# Patient Record
Sex: Male | Born: 1955 | Hispanic: No | Marital: Married | Smoking: Current some day smoker
Health system: Southern US, Community
[De-identification: ages and names within clinical notes are randomized; demographics above are authoritative.]

## PROBLEM LIST (undated history)

## (undated) DIAGNOSIS — K223 Perforation of esophagus: Secondary | ICD-10-CM

## (undated) DIAGNOSIS — I10 Essential (primary) hypertension: Secondary | ICD-10-CM

## (undated) HISTORY — DX: Perforation of esophagus: K22.3

---

## 2010-01-09 ENCOUNTER — Emergency Department (HOSPITAL_COMMUNITY): Admission: EM | Admit: 2010-01-09 | Discharge: 2010-01-09 | Payer: Self-pay | Admitting: Family Medicine

## 2010-05-09 ENCOUNTER — Emergency Department (HOSPITAL_COMMUNITY)
Admission: EM | Admit: 2010-05-09 | Discharge: 2010-05-09 | Payer: Self-pay | Source: Home / Self Care | Admitting: Family Medicine

## 2010-06-06 ENCOUNTER — Emergency Department (HOSPITAL_COMMUNITY)
Admission: EM | Admit: 2010-06-06 | Discharge: 2010-06-06 | Payer: Self-pay | Source: Home / Self Care | Admitting: Family Medicine

## 2010-09-16 ENCOUNTER — Emergency Department (HOSPITAL_COMMUNITY)
Admission: EM | Admit: 2010-09-16 | Discharge: 2010-09-17 | Disposition: A | Discharge status: Home / Self Care | Payer: Self-pay | Attending: Emergency Medicine | Admitting: Emergency Medicine

## 2010-09-16 DIAGNOSIS — R1110 Vomiting, unspecified: Secondary | ICD-10-CM | POA: Insufficient documentation

## 2010-09-16 DIAGNOSIS — R079 Chest pain, unspecified: Secondary | ICD-10-CM | POA: Insufficient documentation

## 2010-09-16 DIAGNOSIS — R1310 Dysphagia, unspecified: Secondary | ICD-10-CM | POA: Insufficient documentation

## 2010-09-16 DIAGNOSIS — R252 Cramp and spasm: Secondary | ICD-10-CM | POA: Insufficient documentation

## 2010-09-17 ENCOUNTER — Emergency Department (HOSPITAL_COMMUNITY): Payer: Self-pay

## 2010-09-17 LAB — COMPREHENSIVE METABOLIC PANEL
BUN: 19 mg/dL (ref 6–23)
CO2: 25 mEq/L (ref 19–32)
Calcium: 9.5 mg/dL (ref 8.4–10.5)
Chloride: 105 mEq/L (ref 96–112)
Creatinine, Ser: 0.98 mg/dL (ref 0.4–1.5)
GFR calc Af Amer: >60 mL/min (ref >60)
GFR calc non Af Amer: >60 mL/min (ref >60)
Glucose, Bld: 93 mg/dL (ref 70–99)
Total Bilirubin: 1.7 mg/dL — ABNORMAL HIGH (ref 0.3–1.2)

## 2010-09-17 LAB — CBC
Hemoglobin: 15.4 g/dL (ref 13.0–17.0)
MCH: 29.7 pg (ref 26.0–34.0)
MCHC: 34.5 g/dL (ref 30.0–36.0)
MCV: 86.3 fL (ref 78.0–100.0)
RBC: 5.18 MIL/uL (ref 4.22–5.81)

## 2013-08-18 ENCOUNTER — Emergency Department (HOSPITAL_COMMUNITY): Payer: 59

## 2013-08-18 ENCOUNTER — Emergency Department (HOSPITAL_COMMUNITY)
Admission: EM | Admit: 2013-08-18 | Discharge: 2013-08-18 | Disposition: A | Discharge status: Home / Self Care | Payer: 59 | Attending: Emergency Medicine | Admitting: Emergency Medicine

## 2013-08-18 ENCOUNTER — Encounter (HOSPITAL_COMMUNITY): Payer: Self-pay | Admitting: Emergency Medicine

## 2013-08-18 DIAGNOSIS — J189 Pneumonia, unspecified organism: Secondary | ICD-10-CM

## 2013-08-18 DIAGNOSIS — J159 Unspecified bacterial pneumonia: Secondary | ICD-10-CM | POA: Insufficient documentation

## 2013-08-18 DIAGNOSIS — R1310 Dysphagia, unspecified: Secondary | ICD-10-CM | POA: Insufficient documentation

## 2013-08-18 LAB — BASIC METABOLIC PANEL
BUN: 16 mg/dL (ref 6–23)
CALCIUM: 9.1 mg/dL (ref 8.4–10.5)
CO2: 24 mEq/L (ref 19–32)
CREATININE: 1.17 mg/dL (ref 0.50–1.35)
Chloride: 104 mEq/L (ref 96–112)
GFR calc non Af Amer: 68 mL/min — ABNORMAL LOW (ref >90)
GFR, EST AFRICAN AMERICAN: 78 mL/min — AB (ref >90)
Glucose, Bld: 141 mg/dL — ABNORMAL HIGH (ref 70–99)
Potassium: 4.6 mEq/L (ref 3.7–5.3)
Sodium: 140 mEq/L (ref 137–147)

## 2013-08-18 LAB — CBC
HCT: 48.7 % (ref 39.0–52.0)
Hemoglobin: 17.4 g/dL — ABNORMAL HIGH (ref 13.0–17.0)
MCH: 31.7 pg (ref 26.0–34.0)
MCHC: 35.7 g/dL (ref 30.0–36.0)
MCV: 88.7 fL (ref 78.0–100.0)
PLATELETS: 162 10*3/uL (ref 150–400)
RBC: 5.49 MIL/uL (ref 4.22–5.81)
RDW: 13.5 % (ref 11.5–15.5)
WBC: 6.6 10*3/uL (ref 4.0–10.5)

## 2013-08-18 LAB — I-STAT TROPONIN, ED: TROPONIN I, POC: 0.01 ng/mL (ref 0.00–0.08)

## 2013-08-18 MED ORDER — AZITHROMYCIN 250 MG PO TABS: ORAL_TABLET | ORAL | Status: DC

## 2013-08-18 MED ORDER — DEXTROSE 5 % IV SOLN
1.0000 g | Freq: Once | INTRAVENOUS | Status: AC
  Administered 2013-08-18: 1 g via INTRAVENOUS
  Filled 2013-08-18: qty 10

## 2013-08-18 MED ORDER — SUCRALFATE 1 G PO TABS: 1.0000 g | ORAL_TABLET | Freq: Three times a day (TID) | ORAL | Status: DC

## 2013-08-18 NOTE — ED Notes (Signed)
Using translation line; pt reports when he eats or drinks, it gets stuck in his chest and he gets a headache, puts his hand in mouth and vomits. Pt reports this for 3.5 years.

## 2013-08-18 NOTE — Discharge Instructions (Signed)
Return here as needed.  Follow up with GI Dr. Provided.  Increase your fluid intake.  There is a pneumonia noted on your chest x-ray

## 2013-08-18 NOTE — ED Provider Notes (Signed)
CSN: [id number]     Arrival date & time 08/18/13  1323 History   First MD Initiated Contact with Patient 08/18/13 1607     Chief Complaint  Patient presents with  . Chest Pain     (Consider location/radiation/quality/duration/timing/severity/associated sxs/prior Treatment) HPI Patient presents emergency department with a year and a half of difficulty swallowing.  The causes a pain in his throat and chest.  Patient also has had a cough for the last 3 days.  The patient, states, that he feels, like that he also has a hard time e to of acid reflux.  History is obtained through the son, who is translating.  There is an appears to be a very accurate historian.  Patient denies nausea, vomiting, diarrhea, abdominal pain, weakness, dizziness, shortness of breath, rash, cough, back pain, neck pain, dysuria, or syncope.  Patient did not take any medications prior to arrival. History reviewed. No pertinent past medical history. History reviewed. No pertinent past surgical history. No family history on file. History  Substance Use Topics  . Smoking status: Never Smoker   . Smokeless tobacco: Not on file  . Alcohol Use: No    Review of Systems  All other systems negative except as documented in the HPI. All pertinent positives and negatives as reviewed in the HPI.   Allergies  Review of patient's allergies indicates no known allergies.  Home Medications  No current outpatient prescriptions on file. BP 162/100  Pulse 73  Temp(Src) 98.3 F (36.8 C) (Oral)  Resp 17  SpO2 100% Physical Exam  Nursing note and vitals reviewed. Constitutional: He is oriented to person, place, and time. He appears well-developed and well-nourished. No distress.  HENT:  Head: Normocephalic and atraumatic.  Mouth/Throat: Oropharynx is clear and moist.  Eyes: Pupils are equal, round, and reactive to light.  Neck: Normal range of motion. Neck supple.  Cardiovascular: Normal rate, regular rhythm and normal heart  sounds.  Exam reveals no gallop and no friction rub.   No murmur heard. Pulmonary/Chest: Effort normal. No respiratory distress. He has no wheezes. He has rhonchi in the right upper field, the right middle field and the right lower field. He has no rales.  Neurological: He is alert and oriented to person, place, and time. He exhibits normal muscle tone. Coordination normal.  Skin: Skin is warm and dry.    ED Course  Procedures (including critical care time) Labs Review Labs Reviewed  CBC - Abnormal; Notable for the following:    Hemoglobin 17.4 (*)    All other components within normal limits  BASIC METABOLIC PANEL - Abnormal; Notable for the following:    Glucose, Bld 141 (*)    GFR calc non Af Amer 68 (*)    GFR calc Af Amer 78 (*)    All other components within normal limits  I-STAT TROPOININ, ED   Imaging Review Dg Chest 2 View  08/18/2013   CLINICAL DATA:  Chest pain  EXAM: CHEST  2 VIEW  COMPARISON:  DG CHEST 2 VIEW dated 09/17/2010  FINDINGS: The lungs are adequately inflated. The interstitial markings are mildly increased bilaterally. The cardiopericardial silhouette is top-normal in size. The pulmonary vascularity is not clearly engorged. There is mild tortuosity of the descending thoracic aorta. There is no pleural effusion or pneumothorax.  IMPRESSION: Mildly increased pulmonary interstitial markings suggest interstitial pneumonia or subsegmental atelectasis. No alveolar pneumonia is demonstrated and there is no overt evidence of CHF. Followup films following therapy are recommended to assure  clearing.   Electronically Signed   By: David  Swazil[place]   On: 08/18/2013 14:37    Patient retreated for possible interstitial pneumonia.  Told to return here as needed.  Given followup with GI for further evaluation of his dysphagia.  This been an ongoing problem.  Told to return here as needed.  The patient in stable here in the emergency department patient's questions were answered and the son  translates.  He voiced understanding of the plan    Carlyle DollyChristopher W Daphnie Venturini, PA-C 08/23/13 0133

## 2013-08-18 NOTE — ED Notes (Signed)
Pt reports for 1.5 years after eating, feels pain in chest and burning in his throat. Reports his chest hurts him now. Denies SOB. Pt speaks [place], pt son translating. Pt is a x4. Denies any cardiac issues.

## 2013-08-18 NOTE — ED Notes (Signed)
[phone] - call this number if pt is discharged [phone] -

## 2013-08-18 NOTE — ED Notes (Signed)
Pt reports non-radiating central CP x 1.5 years, worse when he eats. Described as burning. Also reports SOB x 1.5 years with productive cough. NAD. Denies pain at this time.

## 2013-08-23 NOTE — ED Provider Notes (Signed)
Medical screening examination/treatment/procedure(s) were performed by non-physician practitioner and as supervising physician I was immediately available for consultation/collaboration.   EKG Interpretation   Date/Time:  Friday August 18 2013 13:31:53 EDT Ventricular Rate:  87 PR Interval:  156 QRS Duration: 102 QT Interval:  358 QTC Calculation: 430 R Axis:   56 Text Interpretation:  Normal sinus rhythm Normal ECG ED PHYSICIAN  INTERPRETATION AVAILABLE IN [facility] Confirmed by TEST, Record  ([id number]) on 08/20/2013 9:01:37 AM       Ethelda ChickMartha K Linker, MD 08/23/13 [phone]

## 2014-08-03 ENCOUNTER — Encounter (HOSPITAL_COMMUNITY): Payer: Self-pay | Admitting: Anesthesiology

## 2014-08-03 ENCOUNTER — Encounter (HOSPITAL_COMMUNITY): Payer: Self-pay | Admitting: Physical Medicine and Rehabilitation

## 2014-08-03 ENCOUNTER — Encounter (HOSPITAL_COMMUNITY)
Admission: EM | Disposition: A | Discharge status: Home / Self Care | Payer: Self-pay | Source: Home / Self Care | Attending: Internal Medicine

## 2014-08-03 ENCOUNTER — Inpatient Hospital Stay (HOSPITAL_COMMUNITY)
Admission: EM | Admit: 2014-08-03 | Discharge: 2014-08-10 | DRG: 393 | Disposition: A | Discharge status: Home / Self Care | Payer: 59 | Attending: Internal Medicine | Admitting: Internal Medicine

## 2014-08-03 ENCOUNTER — Emergency Department (HOSPITAL_COMMUNITY): Payer: 59

## 2014-08-03 DIAGNOSIS — R633 Feeding difficulties: Secondary | ICD-10-CM | POA: Diagnosis present

## 2014-08-03 DIAGNOSIS — T18128A Food in esophagus causing other injury, initial encounter: Secondary | ICD-10-CM

## 2014-08-03 DIAGNOSIS — K229 Disease of esophagus, unspecified: Secondary | ICD-10-CM | POA: Diagnosis present

## 2014-08-03 DIAGNOSIS — R198 Other specified symptoms and signs involving the digestive system and abdomen: Secondary | ICD-10-CM

## 2014-08-03 DIAGNOSIS — F17220 Nicotine dependence, chewing tobacco, uncomplicated: Secondary | ICD-10-CM | POA: Diagnosis present

## 2014-08-03 DIAGNOSIS — K223 Perforation of esophagus: Secondary | ICD-10-CM | POA: Diagnosis present

## 2014-08-03 DIAGNOSIS — K210 Gastro-esophageal reflux disease with esophagitis: Secondary | ICD-10-CM | POA: Diagnosis present

## 2014-08-03 DIAGNOSIS — D72829 Elevated white blood cell count, unspecified: Secondary | ICD-10-CM | POA: Diagnosis present

## 2014-08-03 DIAGNOSIS — T18198A Other foreign object in esophagus causing other injury, initial encounter: Secondary | ICD-10-CM | POA: Diagnosis present

## 2014-08-03 DIAGNOSIS — I10 Essential (primary) hypertension: Secondary | ICD-10-CM | POA: Diagnosis present

## 2014-08-03 DIAGNOSIS — R1310 Dysphagia, unspecified: Secondary | ICD-10-CM | POA: Diagnosis present

## 2014-08-03 DIAGNOSIS — T18198S Other foreign object in esophagus causing other injury, sequela: Secondary | ICD-10-CM | POA: Diagnosis not present

## 2014-08-03 HISTORY — PX: ESOPHAGOGASTRODUODENOSCOPY: SHX5428

## 2014-08-03 HISTORY — DX: Essential (primary) hypertension: I10

## 2014-08-03 LAB — CBC WITH DIFFERENTIAL/PLATELET
BASOS ABS: 0 10*3/uL (ref 0.0–0.1)
Basophils Relative: 0 % (ref 0–1)
EOS ABS: 0.3 10*3/uL (ref 0.0–0.7)
Eosinophils Relative: 2 % (ref 0–5)
HCT: 48 % (ref 39.0–52.0)
HEMOGLOBIN: 16.7 g/dL (ref 13.0–17.0)
LYMPHS ABS: 1.7 10*3/uL (ref 0.7–4.0)
LYMPHS PCT: 13 % (ref 12–46)
MCH: 31.4 pg (ref 26.0–34.0)
MCHC: 34.8 g/dL (ref 30.0–36.0)
MCV: 90.2 fL (ref 78.0–100.0)
Monocytes Absolute: 1 10*3/uL (ref 0.1–1.0)
Monocytes Relative: 8 % (ref 3–12)
NEUTROS PCT: 77 % (ref 43–77)
Neutro Abs: 9.9 10*3/uL — ABNORMAL HIGH (ref 1.7–7.7)
PLATELETS: 134 10*3/uL — AB (ref 150–400)
RBC: 5.32 MIL/uL (ref 4.22–5.81)
RDW: 14 % (ref 11.5–15.5)
WBC: 13 10*3/uL — AB (ref 4.0–10.5)

## 2014-08-03 LAB — COMPREHENSIVE METABOLIC PANEL
ALK PHOS: 51 U/L (ref 39–117)
ALT: 22 U/L (ref 0–53)
ANION GAP: 8 (ref 5–15)
AST: 27 U/L (ref 0–37)
Albumin: 3.6 g/dL (ref 3.5–5.2)
BILIRUBIN TOTAL: 2 mg/dL — AB (ref 0.3–1.2)
BUN: 16 mg/dL (ref 6–23)
CALCIUM: 9.2 mg/dL (ref 8.4–10.5)
CO2: 27 mmol/L (ref 19–32)
CREATININE: 1.29 mg/dL (ref 0.50–1.35)
Chloride: 100 mmol/L (ref 96–112)
GFR calc Af Amer: 69 mL/min — ABNORMAL LOW (ref >90)
GFR calc non Af Amer: 60 mL/min — ABNORMAL LOW (ref >90)
GLUCOSE: 137 mg/dL — AB (ref 70–99)
Potassium: 3.8 mmol/L (ref 3.5–5.1)
Sodium: 135 mmol/L (ref 135–145)
Total Protein: 7.8 g/dL (ref 6.0–8.3)

## 2014-08-03 LAB — I-STAT TROPONIN, ED: Troponin i, poc: 0.01 ng/mL (ref 0.00–0.08)

## 2014-08-03 LAB — LIPASE, BLOOD: Lipase: 27 U/L (ref 11–59)

## 2014-08-03 LAB — GLUCOSE, CAPILLARY: GLUCOSE-CAPILLARY: 92 mg/dL (ref 70–99)

## 2014-08-03 SURGERY — EGD (ESOPHAGOGASTRODUODENOSCOPY)
Anesthesia: Moderate Sedation

## 2014-08-03 SURGERY — EGD (ESOPHAGOGASTRODUODENOSCOPY)
Anesthesia: Moderate Sedation | Laterality: Left

## 2014-08-03 MED ORDER — ONDANSETRON 4 MG PO TBDP
4.0000 mg | ORAL_TABLET | Freq: Once | ORAL | Status: AC
  Administered 2014-08-03: 4 mg via ORAL
  Filled 2014-08-03: qty 1

## 2014-08-03 MED ORDER — MIDAZOLAM HCL 10 MG/2ML IJ SOLN
INTRAMUSCULAR | Status: DC | PRN
  Administered 2014-08-03 (×5): 2 mg via INTRAVENOUS

## 2014-08-03 MED ORDER — SODIUM CHLORIDE 0.9 % IV SOLN
INTRAVENOUS | Status: DC
  Administered 2014-08-03: 20 mL/h via INTRAVENOUS

## 2014-08-03 MED ORDER — GI COCKTAIL ~~LOC~~
30.0000 mL | Freq: Once | ORAL | Status: AC
  Administered 2014-08-03: 30 mL via ORAL
  Filled 2014-08-03: qty 30

## 2014-08-03 MED ORDER — SODIUM CHLORIDE 0.9 % IV SOLN
INTRAVENOUS | Status: DC
  Administered 2014-08-03: 20:00:00 via INTRAVENOUS

## 2014-08-03 MED ORDER — ONDANSETRON HCL 4 MG/2ML IJ SOLN
4.0000 mg | Freq: Four times a day (QID) | INTRAMUSCULAR | Status: DC | PRN
Start: 2014-08-03 — End: 2014-08-10

## 2014-08-03 MED ORDER — SODIUM CHLORIDE 0.9 % IV SOLN
INTRAVENOUS | Status: AC
Start: 2014-08-03 — End: 2014-08-07
  Administered 2014-08-03: 20:00:00 via INTRAVENOUS
  Administered 2014-08-04 – 2014-08-05 (×2): 1000 mL via INTRAVENOUS
  Administered 2014-08-07: 12:00:00 via INTRAVENOUS

## 2014-08-03 MED ORDER — FENTANYL CITRATE 0.05 MG/ML IJ SOLN
INTRAMUSCULAR | Status: DC | PRN
  Administered 2014-08-03 (×4): 25 ug via INTRAVENOUS

## 2014-08-03 MED ORDER — PANTOPRAZOLE SODIUM 40 MG IV SOLR
40.0000 mg | INTRAVENOUS | Status: DC
  Administered 2014-08-03 – 2014-08-05 (×3): 40 mg via INTRAVENOUS
  Filled 2014-08-03 (×4): qty 40

## 2014-08-03 MED ORDER — MORPHINE SULFATE 2 MG/ML IJ SOLN
1.0000 mg | INTRAMUSCULAR | Status: DC | PRN
  Administered 2014-08-03 – 2014-08-06 (×7): 1 mg via INTRAVENOUS
  Filled 2014-08-03 (×7): qty 1

## 2014-08-03 MED ORDER — IOHEXOL 300 MG/ML  SOLN: 100.0000 mL | Freq: Once | INTRAMUSCULAR | Status: AC | PRN

## 2014-08-03 NOTE — Op Note (Addendum)
[facility] [address] [place], [id number]   OPERATIVE PROCEDURE REPORT  PATIENT :[name] Daugherty, Dario  MR#: [id number] BIRTHDATE :06-10-1955 GENDER: male ENDOSCOPIST: Lorenza BurtonJyothi N Bedelia Pong, MD ASSISTANT:   Omelia BlackwaterShelby Carpenter, RNM & Windell HummingbirdSamuel Tetteh, technician. PROCEDURE DATE: 08/03/2014 PRE-PROCEDURE PREPERATION: Patient fasted for 4 hours prior to procedure. PRE-PROCEDURE PHYSICAL: Patient has stable vital signs.  Neck is supple.  There is no JVD, thyromegaly or LAD.  Chest clear to auscultation.  S1 and S2 regular.  Abdomen soft, non-distended, non-tender with NABS. PROCEDURE:     Esophagoscopy with attempt to remove foreign body.  ASA CLASS:     Class II INDICATIONS:     1) Dysphagia 2) GERD. MEDICATIONS:     Fentanyl 100 mcg and Versed 10 mg IV. TOPICAL ANESTHETIC:   none given.  DESCRIPTION OF PROCEDURE: After the risks benefits and alternatives of the procedure were thoroughly explained, informed consent was obtained.  The [employer] G[employer]OR[employer]COP[employer] [id number]  was introduced through the mouth and advanced to the second portion of the duodenum , without limitations. The instrument was slowly withdrawn as the mucosa was fully examined. Estimated blood loss is zero unless otherwise noted in this procedure report.    ESOPHAGUS: There was a foreign body impacted in the distal esophagus above the GEJ; The proximal esophagus widely patent. Inspite of multiple attempts I was unable to remocve this foreign body or osuh it into the stomach. We tried using the alligator forceps but it was diffcult to dislodge it. The stomach was not intubated. The procedure was terminated with plans to redo the procedure in the OR after intubating the patient. d. The scope was then withdrawn from the patient and the procedure terminated. The patient tolerated the procedure without immediate complications.  IMPRESSION:  Foreign body impacted in distal esophagus at the GEJ.  RECOMMENDATIONS:      1) Admit patient to the hospital. 2) Keep him NPO for now. 3) Start PPI's IV.  REPEAT EXAM:  for EGD with removal of foreign body with MAC in the OR.; Case discussed with Dr. Mammie RussianHendrickson-CVTS for backup.  DISCHARGE INSTRUCTIONS: standard instructions given.  _______________________________ eSignedLorenza Burton:  Camara Rosander N Clement Deneault, MD 08/03/2014 4:11 PM Revised: 08/03/2014 4:11 PM  CPT CODES:     43230 Upper gastrointestinal endoscopy of esophagus  DIAGNOSIS CODES:     R13.10 Dysphagia,unspecified, K21.9  The ICD and CPT codes recommended by this software are interpretations from the data that the clinical staff has captured with the software.  The verification of the translation of this report to the ICD and CPT codes and modifiers is the sole responsibility of the health care institution and practicing physician where this report was generated.  [employer] will not be held responsible for the validity of the ICD and CPT codes included on this report.  AMA assumes no liability for data contained or not contained herein. CPT is a Publishing rights managerregistered trademark of the [employer].   CC: Dr. Annitta JerseyPlunkett-ER MD  PATIENT NAME:  [name] Daugherty, Gualberto MR#: [id number]

## 2014-08-03 NOTE — ED Notes (Signed)
Pt transported to endoscopy, wife and belongings with pt

## 2014-08-03 NOTE — ED Provider Notes (Addendum)
CSN: [id number]     Arrival date & time 08/03/14  1018 History   First MD Initiated Contact with Patient 08/03/14 1019     Chief Complaint  Patient presents with  . Dysphagia  . Abdominal Pain     (Consider location/radiation/quality/duration/timing/severity/associated sxs/prior Treatment) Patient is a 59 y.o. male presenting with abdominal pain. The history is provided by the patient. The history is limited by a language barrier. A language interpreter was used (family member).  Abdominal Pain Pain location:  Epigastric Pain quality: burning and sharp   Pain radiates to:  Chest Pain severity:  Moderate Onset quality:  Gradual Duration:  3 days Timing:  Constant Progression:  Worsening Chronicity:  Recurrent Context: eating   Context: not alcohol use   Context comment:  Family member states that for the last 3 days with eating he has been vomiting anytime he eats or drinks. Relieved by:  Nothing Worsened by:  Eating Ineffective treatments:  Acetaminophen Associated symptoms: anorexia, fever, nausea and vomiting   Associated symptoms: no cough, no diarrhea, no dysuria, no hematemesis, no shortness of breath and no sore throat   Associated symptoms comment:  Pt denies choking on any food prior to symptoms starting.  No productive cough, tobacco use or heavy alcohol use.  Takes no medications regularly Risk factors: no alcohol abuse, no aspirin use and has not had multiple surgeries     History reviewed. No pertinent past medical history. History reviewed. No pertinent past surgical history. No family history on file. History  Substance Use Topics  . Smoking status: Never Smoker   . Smokeless tobacco: Not on file  . Alcohol Use: Yes     Comment: social    Review of Systems  Constitutional: Positive for fever.  HENT: Negative for sore throat.   Respiratory: Negative for cough and shortness of breath.   Gastrointestinal: Positive for nausea, vomiting, abdominal pain and  anorexia. Negative for diarrhea and hematemesis.  Genitourinary: Negative for dysuria.  All other systems reviewed and are negative.     Allergies  Review of patient's allergies indicates no known allergies.  Home Medications   Prior to Admission medications   Medication Sig Start Date End Date Taking? Authorizing Provider  azithromycin (ZITHROMAX) 250 MG tablet 2 by mouth day one. 1 by mouth days 2 through 5 08/18/13   Charlestine Night, PA-C  sucralfate (CARAFATE) 1 G tablet Take 1 tablet (1 g total) by mouth 4 (four) times daily -  with meals and at bedtime. 08/18/13   Christopher Lawyer, PA-C   BP 179/97 mmHg  Pulse 89  Temp(Src) 98.7 F (37.1 C) (Oral)  Resp 17  SpO2 100% Physical Exam  Constitutional: He is oriented to person, place, and time. He appears well-developed and well-nourished. No distress.  HENT:  Head: Normocephalic and atraumatic.  Mouth/Throat: Oropharynx is clear and moist.  Eyes: Conjunctivae and EOM are normal. Pupils are equal, round, and reactive to light.  Neck: Normal range of motion. Neck supple.  Cardiovascular: Normal rate, regular rhythm and intact distal pulses.   No murmur heard. Pulmonary/Chest: Effort normal and breath sounds normal. No respiratory distress. He has no wheezes. He has no rales.  Abdominal: Soft. He exhibits no distension. There is tenderness in the epigastric area. There is no rebound and no guarding.  Musculoskeletal: Normal range of motion. He exhibits no edema or tenderness.  Neurological: He is alert and oriented to person, place, and time.  Skin: Skin is warm and dry. No  rash noted. No erythema.  Psychiatric: He has a normal mood and affect. His behavior is normal.  Nursing note and vitals reviewed.   ED Course  Procedures (including critical care time) Labs Review Labs Reviewed  CBC WITH DIFFERENTIAL/PLATELET - Abnormal; Notable for the following:    WBC 13.0 (*)    Platelets 134 (*)    Neutro Abs 9.9 (*)    All  other components within normal limits  COMPREHENSIVE METABOLIC PANEL - Abnormal; Notable for the following:    Glucose, Bld 137 (*)    Total Bilirubin 2.0 (*)    GFR calc non Af Amer 60 (*)    GFR calc Af Amer 69 (*)    All other components within normal limits  LIPASE, BLOOD  I-STAT TROPOININ, ED    Imaging Review No results found.   EKG Interpretation   Date/Time:  Friday August 03 2014 10:47:03 EDT Ventricular Rate:  80 PR Interval:  161 QRS Duration: 102 QT Interval:  348 QTC Calculation: 401 R Axis:   81 Text Interpretation:  Sinus rhythm Probable left atrial enlargement No  significant change since last tracing Confirmed by Anitra LauthPLUNKETT  MD, Alphonzo LemmingsWHITNEY  [phone]([id number]) on 08/03/2014 11:00:25 AM      MDM   Final diagnoses:  Dysphagia  Food impaction of esophagus, initial encounter    Patient does not speak English who is here with a family member who does. Patient presents today with a three-day history of ongoing vomiting. He is complaining of epigastric pain that radiates into his chest. Family member states anytime he tries to eat anything he vomits it back up. It is difficult to discern if patient is having regurgitation versus vomiting. However he does not recall eating or choking on any food prior to symptoms starting. Family member states last night he had a fever but they gave him Tylenol. Patient denies cough or respiratory symptoms. Patient does not use tobacco is not a heavy alcohol user and takes no medications regularly. Patient does not see a doctor regularly.  Concern for GERD versus viral syndrome versus possible esophageal stricture. CBC, CMP, lipase, troponin, EKG pending. Patient given ODT Zofran and will try a GI cocktail  11:48 AM Labs with a mild leukocytosis of 13,000 but otherwise normal troponin and CMP and lipase. Patient has no lower abdominal tenderness or pain concerning for appendicitis or diverticulitis.  After Zofran and GI cocktail patient is feeling  better.   1:15 PM However when attempting to by mouth challenge patient is unable to keep down applesauce or more solid food. He immediately regurgitates. Concerned that he possibly could have been esophageal stricture or food bolus impaction. Will discuss with GI  1:47 PM Discussed with Dr. Loreta AveMann who will take pt for endoscopy.  3:59 PM Endoscopy patient had a friable area with bleeding and possibly of bone or other obstructing mass causing his dysphasia. Patient will need to be admitted and will go back for repeat endoscopy tomorrow. Also will do a CT for further evaluation  Gwyneth SproutWhitney Tudor Chandley, MD 08/03/14 1349  Gwyneth SproutWhitney Tyriq Moragne, MD 08/03/14 1559

## 2014-08-03 NOTE — Progress Notes (Signed)
[name] Simpsonek Hartinger [id number] Admission Data: 08/03/2014 6:56 PM Attending Provider: Zannie CovePreetha Joseph, MD  PCP:No PCP Per Patient Consults/ Treatment Team:    [name] Daugherty is a 59 y.o. male patient admitted from ED awake, alert  & orientated  X 3,  No Order, VSS - Blood pressure 154/88, pulse 72, temperature 98.7 F (37.1 C), temperature source Oral, resp. rate 16, SpO2 99 %.,no c/o shortness of breath, no c/o chest pain, no distress noted.   IV site WDL:  Right arm, running NS at 4020ml/hour.  Allergies:  No Known Allergies   Past Medical History  Diagnosis Date  . Hypertension      Pt orientation to unit, room and routine. Information packet given to patient/family.  Admission INP armband ID verified with patient/family, and in place. SR up x 2, fall risk assessment complete with Patient and family verbalizing understanding of risks associated with falls. Pt verbalizes an understanding of how to use the call bell and to call for help before getting out of bed.  Skin, clean-dry- intact without evidence of bruising, or skin tears.   No evidence of skin break down noted on exam.    Will cont to monitor and assist as needed.  [name] Daugherty, [name] Cooner L, RN 08/03/2014 6:56 PM

## 2014-08-03 NOTE — H&P (Signed)
[facility] History and Physical  Alwin Lanigan XBM:[id number] DOB: 09-Apr-1956 DOA: 08/03/2014  Referring physician: EDP PCP: No PCP Per Patient   Chief Complaint: dysphagia  HPI: Denilson Salminen is a 59 y.o. male of Nepali descent with no significant PMH presents to the ER with intermittent dysphagia for the last 2years, this worsened in the last week and now noted to have dysphagia with solids and liquids. Translated by sons, they deny any fevers, chills, night sweats. No weight loss noted by family. Seen in Er by Dr.Mann who did an EGD and found a suspected foreign body than could not be retrieved.   Review of Systems: positives bolded Constitutional:  No weight loss, night sweats, Fevers, chills, fatigue.  HEENT:  No headaches, Difficulty swallowing,Tooth/dental problems,Sore throat,  No sneezing, itching, ear ache, nasal congestion, post nasal drip,  Cardio-vascular:  No chest pain, Orthopnea, PND, swelling in lower extremities, anasarca, dizziness, palpitations  GI:  No heartburn, indigestion, abdominal pain, nausea, vomiting, diarrhea, change in bowel habits, loss of appetite  Resp:  No shortness of breath with exertion or at rest. No excess mucus, no productive cough, No non-productive cough, No coughing up of blood.No change in color of mucus.No wheezing.No chest wall deformity  Skin:  no rash or lesions.  GU:  no dysuria, change in color of urine, no urgency or frequency. No flank pain.  Musculoskeletal:  No joint pain or swelling. No decreased range of motion. No back pain.  Psych:  No change in mood or affect. No depression or anxiety. No memory loss.   Past Medical History  Diagnosis Date  . Hypertension    History reviewed. No pertinent past surgical history. Social History:  reports that he has never smoked. He does not have any smokeless tobacco history on file. He reports that he drinks alcohol. He reports that he does not use illicit drugs.  No Known  Allergies  Family history. -unknown, reports parents died of old age  Prior to Admission medications   Not on File   Physical Exam: Filed Vitals:   08/03/14 1535 08/03/14 1600 08/03/14 1645 08/03/14 1700  BP: 168/106 117/86 114/85 113/90  Pulse: 93 87 80 83  Temp:      TempSrc:      Resp: SpO2: 99% 92% 99% 98%    Wt Readings from Last 3 Encounters:  No data found for Wt    General:  Appears calm and comfortable, no distress, AOx3 Eyes: PERRL, normal lids, irises & conjunctiva ENT: grossly normal hearing, lips & tongue Neck: no LAD, masses or thyromegaly Cardiovascular: RRR, no m/r/g. No LE edema. Telemetry: SR, no arrhythmias  Respiratory: CTA bilaterally, no w/r/r. Normal respiratory effort. Abdomen: soft, Nt, ND, BS present Skin: no rash or induration seen on limited exam Musculoskeletal: grossly normal tone BUE/BLE Psychiatric: grossly normal mood and affect, speech fluent and appropriate Neurologic: grossly non-focal.          Labs on Admission:  Basic Metabolic Panel:  Recent Labs Lab 08/03/14 1044  NA 135  K 3.8  CL 100  CO2 27  GLUCOSE 137*  BUN 16  CREATININE 1.29  CALCIUM 9.2   Liver Function Tests:  Recent Labs Lab 08/03/14 1044  AST 27  ALT 22  ALKPHOS 51  BILITOT 2.0*  PROT 7.8  ALBUMIN 3.6    Recent Labs Lab 08/03/14 1044  LIPASE 27   No results for input(s): AMMONIA in the last 168 hours. CBC:  Recent  Labs Lab 08/03/14 1044  WBC 13.0*  NEUTROABS 9.9*  HGB 16.7  HCT 48.0  MCV 90.2  PLT 134*   Cardiac Enzymes: No results for input(s): CKTOTAL, CKMB, CKMBINDEX, TROPONINI in the last 168 hours.  BNP (last 3 results) No results for input(s): BNP in the last 8760 hours.  ProBNP (last 3 results) No results for input(s): PROBNP in the last 8760 hours.  CBG: No results for input(s): GLUCAP in the last 168 hours.  Radiological Exams on Admission: No results found.  Assessment/Plan     1.Dysphagia -due to foreign body with possible underlying mass -long h/o chewing tobacco -CT abd pelvis ordered by EDP, just completed will FU -D/w Dr.Mann, plan for repeat EGD under anesthesia tomorrow  -Keep NPo except ice chips for now and then NPO after midnight -IV PPI, IVF -further management as condition evolves   Code Status: Full Code DVT Prophylaxis: SCds  Family Communication: d/w sons at bedside Disposition Plan: inpatient  Time spent: 60min  [facility] Health SystemJOSEPH,Perel Hauschild [facility] Pager [phone]

## 2014-08-03 NOTE — ED Notes (Signed)
Pt presents to department for evaluation of difficulty swallowing and epigastric pain. Wife states he is unable to swallow foods, also states epigastric tenderness. Ongoing x3 days, states issues with same in the past. Pt is alert and oriented x4.

## 2014-08-03 NOTE — ED Notes (Signed)
MD at bedside. 

## 2014-08-03 NOTE — ED Notes (Signed)
Attempted report 

## 2014-08-03 NOTE — Consult Note (Signed)
Unassigned patient Reason for Consult: ?Food impaction/GERD. Referring Physician: Dr. Maryan Rued.  [name] Daugherty is an 59 y.o. male.  HPI: 59 year old [place] male, presented to the ED today complaining of difficulty swallowing solids since last Tuesday 07/31/14. He has had a 2 year history of progressive dysphagia but for the last 4 days he has not even been able to keep liquids down. He has a good appetite and his weight has been stable. He denies a history of melena or hematochezia. He has been having some epigastric paina nd chest pain since 07/31/14. Most of the history was procured with the help of an interpreter.      History reviewed. No pertinent past medical history.  History reviewed. No pertinent past surgical history.  No family history on file.  Social History:  reports that he has never smoked. He chews tobacco and has been doing this for several years. He reports that he drinks alcohol, 1-2 beers per day. He reports that he does not use illicit drugs.  Allergies: No Known Allergies  Medications: I have reviewed the patient's current medications.  Results for orders placed or performed during the hospital encounter of 08/03/14 (from the past 48 hour(s))  CBC with Differential/Platelet     Status: Abnormal   Collection Time: 08/03/14 10:44 AM  Result Value Ref Range   WBC 13.0 (H) 4.0 - 10.5 K/uL   RBC 5.32 4.22 - 5.81 MIL/uL   Hemoglobin 16.7 13.0 - 17.0 g/dL   HCT 48.0 39.0 - 52.0 %   MCV 90.2 78.0 - 100.0 fL   MCH 31.4 26.0 - 34.0 pg   MCHC 34.8 30.0 - 36.0 g/dL   RDW 14.0 11.5 - 15.5 %   Platelets 134 (L) 150 - 400 K/uL   Neutrophils Relative % 77 43 - 77 %   Neutro Abs 9.9 (H) 1.7 - 7.7 K/uL   Lymphocytes Relative 13 12 - 46 %   Lymphs Abs 1.7 0.7 - 4.0 K/uL   Monocytes Relative 8 3 - 12 %   Monocytes Absolute 1.0 0.1 - 1.0 K/uL   Eosinophils Relative 2 0 - 5 %   Eosinophils Absolute 0.3 0.0 - 0.7 K/uL   Basophils Relative 0 0 - 1 %   Basophils Absolute 0.0 0.0  - 0.1 K/uL  Comprehensive metabolic panel     Status: Abnormal   Collection Time: 08/03/14 10:44 AM  Result Value Ref Range   Sodium 135 135 - 145 mmol/L   Potassium 3.8 3.5 - 5.1 mmol/L   Chloride 100 96 - 112 mmol/L   CO2 27 19 - 32 mmol/L   Glucose, Bld 137 (H) 70 - 99 mg/dL   BUN 16 6 - 23 mg/dL   Creatinine, Ser 1.29 0.50 - 1.35 mg/dL   Calcium 9.2 8.4 - 10.5 mg/dL   Total Protein 7.8 6.0 - 8.3 g/dL   Albumin 3.6 3.5 - 5.2 g/dL   AST 27 0 - 37 U/L   ALT 22 0 - 53 U/L   Alkaline Phosphatase 51 39 - 117 U/L   Total Bilirubin 2.0 (H) 0.3 - 1.2 mg/dL   GFR calc non Af Amer 60 (L) >90 mL/min   GFR calc Af Amer 69 (L) >90 mL/min    Comment: (NOTE) The eGFR has been calculated using the CKD EPI equation. This calculation has not been validated in all clinical situations. eGFR's persistently <90 mL/min signify possible Chronic Kidney Disease.    Anion gap 8 5 -  15  Lipase, blood     Status: None   Collection Time: 08/03/14 10:44 AM  Result Value Ref Range   Lipase 27 11 - 59 U/L  I-stat troponin, ED     Status: None   Collection Time: 08/03/14 10:50 AM  Result Value Ref Range   Troponin i, poc 0.01 0.00 - 0.08 ng/mL   Comment 3            Comment: Due to the release kinetics of cTnI, a negative result within the first hours of the onset of symptoms does not rule out myocardial infarction with certainty. If myocardial infarction is still suspected, repeat the test at appropriate intervals.    No results found.  Review of Systems  Constitutional: Negative.   HENT: Negative.   Eyes: Negative.   Respiratory: Negative.   Cardiovascular: Negative.   Gastrointestinal: Positive for heartburn, nausea, vomiting and abdominal pain. Negative for diarrhea, constipation, blood in stool and melena.  Genitourinary: Negative.   Musculoskeletal: Negative.   Skin: Negative.   Neurological: Negative.   Psychiatric/Behavioral: Negative.    Blood pressure 146/95, pulse 81,  temperature 99.1 F (37.3 C), temperature source Oral, resp. rate 23, SpO2 96 %. Physical Exam  Constitutional: He is oriented to person, place, and time. He appears well-developed and well-nourished.  He has poor dentition with tobacco stained teeth  HENT:  Head: Normocephalic and atraumatic.  Eyes: Conjunctivae and EOM are normal. Pupils are equal, round, and reactive to light.  Neck: Normal range of motion. Neck supple.  GI: Soft. Bowel sounds are normal.  Musculoskeletal: Normal range of motion.  Neurological: He is alert and oriented to person, place, and time.  Skin: Skin is warm and dry.  Psychiatric: He has a normal mood and affect. His behavior is normal. Judgment and thought content normal.   Assessment/Plan: Epigastric pain with dysphagia and GERD: Proceed with an EGD now.   [name] Daugherty 08/03/2014, 2:28 PM

## 2014-08-03 NOTE — Progress Notes (Signed)
MD aware of BP

## 2014-08-04 ENCOUNTER — Encounter (HOSPITAL_COMMUNITY)
Admission: EM | Disposition: A | Discharge status: Home / Self Care | Payer: Self-pay | Source: Home / Self Care | Attending: Internal Medicine

## 2014-08-04 ENCOUNTER — Inpatient Hospital Stay (HOSPITAL_COMMUNITY): Payer: 59 | Admitting: Anesthesiology

## 2014-08-04 ENCOUNTER — Encounter (HOSPITAL_COMMUNITY): Payer: Self-pay

## 2014-08-04 ENCOUNTER — Inpatient Hospital Stay (HOSPITAL_COMMUNITY): Payer: 59

## 2014-08-04 DIAGNOSIS — K223 Perforation of esophagus: Secondary | ICD-10-CM

## 2014-08-04 HISTORY — PX: ESOPHAGOGASTRODUODENOSCOPY: SHX5428

## 2014-08-04 LAB — CBC
HCT: 45.3 % (ref 39.0–52.0)
HEMOGLOBIN: 15.4 g/dL (ref 13.0–17.0)
MCH: 30.4 pg (ref 26.0–34.0)
MCHC: 34 g/dL (ref 30.0–36.0)
MCV: 89.5 fL (ref 78.0–100.0)
PLATELETS: 131 10*3/uL — AB (ref 150–400)
RBC: 5.06 MIL/uL (ref 4.22–5.81)
RDW: 13.9 % (ref 11.5–15.5)
WBC: 12.6 10*3/uL — ABNORMAL HIGH (ref 4.0–10.5)

## 2014-08-04 LAB — BASIC METABOLIC PANEL
Anion gap: 4 — ABNORMAL LOW (ref 5–15)
BUN: 15 mg/dL (ref 6–23)
CALCIUM: 8.6 mg/dL (ref 8.4–10.5)
CHLORIDE: 105 mmol/L (ref 96–112)
CO2: 28 mmol/L (ref 19–32)
Creatinine, Ser: 1.16 mg/dL (ref 0.50–1.35)
GFR calc Af Amer: 78 mL/min — ABNORMAL LOW (ref >90)
GFR, EST NON AFRICAN AMERICAN: 68 mL/min — AB (ref >90)
GLUCOSE: 86 mg/dL (ref 70–99)
Potassium: 4 mmol/L (ref 3.5–5.1)
Sodium: 137 mmol/L (ref 135–145)

## 2014-08-04 LAB — PROTIME-INR
INR: 1.33 (ref 0.00–1.49)
Prothrombin Time: 16.6 seconds — ABNORMAL HIGH (ref 11.6–15.2)

## 2014-08-04 LAB — GLUCOSE, CAPILLARY
GLUCOSE-CAPILLARY: 87 mg/dL (ref 70–99)
Glucose-Capillary: 88 mg/dL (ref 70–99)
Glucose-Capillary: 123 mg/dL — ABNORMAL HIGH (ref 70–99)

## 2014-08-04 SURGERY — ESOPHAGOGASTRODUODENOSCOPY (EGD) WITH PROPOFOL
Anesthesia: Monitor Anesthesia Care | Laterality: Left

## 2014-08-04 SURGERY — EGD (ESOPHAGOGASTRODUODENOSCOPY)
Anesthesia: Monitor Anesthesia Care

## 2014-08-04 SURGERY — EGD (ESOPHAGOGASTRODUODENOSCOPY)
Anesthesia: General

## 2014-08-04 MED ORDER — PROPOFOL 10 MG/ML IV BOLUS
INTRAVENOUS | Status: AC
  Filled 2014-08-04: qty 20

## 2014-08-04 MED ORDER — ONDANSETRON HCL 4 MG/2ML IJ SOLN
INTRAMUSCULAR | Status: DC | PRN
  Administered 2014-08-04: 4 mg via INTRAVENOUS

## 2014-08-04 MED ORDER — LIDOCAINE HCL (CARDIAC) 20 MG/ML IV SOLN
INTRAVENOUS | Status: DC | PRN
  Administered 2014-08-04: 80 mg via INTRAVENOUS

## 2014-08-04 MED ORDER — VECURONIUM BROMIDE 10 MG IV SOLR
INTRAVENOUS | Status: DC | PRN
  Administered 2014-08-04: 3 mg via INTRAVENOUS

## 2014-08-04 MED ORDER — FENTANYL CITRATE 0.05 MG/ML IJ SOLN
INTRAMUSCULAR | Status: DC | PRN
  Administered 2014-08-04: 100 ug via INTRAVENOUS
  Administered 2014-08-04: 50 ug via INTRAVENOUS
  Administered 2014-08-04: 100 ug via INTRAVENOUS

## 2014-08-04 MED ORDER — DEXAMETHASONE SODIUM PHOSPHATE 4 MG/ML IJ SOLN
INTRAMUSCULAR | Status: DC | PRN
  Administered 2014-08-04: 4 mg via INTRAVENOUS

## 2014-08-04 MED ORDER — FENTANYL CITRATE 0.05 MG/ML IJ SOLN: 25.0000 ug | INTRAMUSCULAR | Status: DC | PRN

## 2014-08-04 MED ORDER — IOHEXOL 300 MG/ML  SOLN
150.0000 mL | Freq: Once | INTRAMUSCULAR | Status: AC | PRN
  Administered 2014-08-04: 150 mL via ORAL

## 2014-08-04 MED ORDER — IOHEXOL 300 MG/ML  SOLN
150.0000 mL | Freq: Once | INTRAMUSCULAR | Status: AC | PRN
Start: 2014-08-04 — End: 2014-08-04
  Administered 2014-08-04: 150 mL via ORAL

## 2014-08-04 MED ORDER — GLYCOPYRROLATE 0.2 MG/ML IJ SOLN
INTRAMUSCULAR | Status: DC | PRN
  Administered 2014-08-04: 0.3 mg via INTRAVENOUS

## 2014-08-04 MED ORDER — METOCLOPRAMIDE HCL 5 MG/ML IJ SOLN
INTRAMUSCULAR | Status: DC | PRN
  Administered 2014-08-04: 10 mg via INTRAVENOUS

## 2014-08-04 MED ORDER — PIPERACILLIN-TAZOBACTAM IN DEX 2-0.25 GM/50ML IV SOLN: 2.2500 g | Freq: Four times a day (QID) | INTRAVENOUS | Status: DC

## 2014-08-04 MED ORDER — MIDAZOLAM HCL 5 MG/5ML IJ SOLN
INTRAMUSCULAR | Status: DC | PRN
  Administered 2014-08-04: 2 mg via INTRAVENOUS

## 2014-08-04 MED ORDER — MIDAZOLAM HCL 2 MG/2ML IJ SOLN
INTRAMUSCULAR | Status: AC
  Filled 2014-08-04: qty 2

## 2014-08-04 MED ORDER — SUCCINYLCHOLINE CHLORIDE 20 MG/ML IJ SOLN
INTRAMUSCULAR | Status: DC | PRN
  Administered 2014-08-04: 120 mg via INTRAVENOUS

## 2014-08-04 MED ORDER — LACTATED RINGERS IV SOLN
INTRAVENOUS | Status: DC | PRN
  Administered 2014-08-04: 11:00:00 via INTRAVENOUS

## 2014-08-04 MED ORDER — ARTIFICIAL TEARS OP OINT
TOPICAL_OINTMENT | OPHTHALMIC | Status: DC | PRN
  Administered 2014-08-04: 1 via OPHTHALMIC

## 2014-08-04 MED ORDER — FENTANYL CITRATE 0.05 MG/ML IJ SOLN
INTRAMUSCULAR | Status: AC
  Filled 2014-08-04: qty 5

## 2014-08-04 MED ORDER — PIPERACILLIN-TAZOBACTAM 3.375 G IVPB
3.3750 g | Freq: Three times a day (TID) | INTRAVENOUS | Status: DC
  Administered 2014-08-04 – 2014-08-10 (×18): 3.375 g via INTRAVENOUS
  Filled 2014-08-04 (×20): qty 50

## 2014-08-04 MED ORDER — NEOSTIGMINE METHYLSULFATE 10 MG/10ML IV SOLN
INTRAVENOUS | Status: DC | PRN
  Administered 2014-08-04: 2.5 mg via INTRAVENOUS

## 2014-08-04 MED ORDER — PROMETHAZINE HCL 25 MG/ML IJ SOLN: 6.2500 mg | INTRAMUSCULAR | Status: DC | PRN

## 2014-08-04 MED ORDER — LABETALOL HCL 5 MG/ML IV SOLN
INTRAVENOUS | Status: DC | PRN
  Administered 2014-08-04: 10 mg via INTRAVENOUS

## 2014-08-04 MED ORDER — PROPOFOL 10 MG/ML IV BOLUS
INTRAVENOUS | Status: DC | PRN
  Administered 2014-08-04: 30 mg via INTRAVENOUS
  Administered 2014-08-04: 120 mg via INTRAVENOUS

## 2014-08-04 NOTE — Anesthesia Procedure Notes (Signed)
Procedure Name: Intubation Date/Time: 08/04/2014 11:27 AM Performed by: Wray KearnsFOLEY, [name] Rickel A Pre-anesthesia Checklist: Patient identified, Timeout performed, Emergency Drugs available, Suction available and Patient being monitored Patient Re-evaluated:Patient Re-evaluated prior to inductionOxygen Delivery Method: Circle system utilized Preoxygenation: Pre-oxygenation with 100% oxygen Intubation Type: IV induction, Rapid sequence and Cricoid Pressure applied Laryngoscope Size: Mac and 4 Grade View: Grade I Tube type: Oral Tube size: 7.5 mm Number of attempts: 1 Airway Equipment and Method: Stylet and Bite block Placement Confirmation: ETT inserted through vocal cords under direct vision,  breath sounds checked- equal and bilateral and positive ETCO2 Secured at: 23 cm Tube secured with: Tape Dental Injury: Teeth and Oropharynx as per pre-operative assessment

## 2014-08-04 NOTE — Transfer of Care (Signed)
Immediate Anesthesia Transfer of Care Note  Patient: [name] Daugherty  Procedure(s) Performed: Procedure(s): FOREIGN BODY REMOVAL ( ESOPHAGEAL MASS ) (N/A)  Patient Location: PACU  Anesthesia Type:General  Level of Consciousness: oriented, sedated, patient cooperative and responds to stimulation  Airway & Oxygen Therapy: Patient Spontanous Breathing and Patient connected to nasal cannula oxygen  Post-op Assessment: Report given to RN, Post -op Vital signs reviewed and stable, Patient moving all extremities and Patient moving all extremities X 4  Post vital signs: Reviewed and stable  Last Vitals:  Filed Vitals:   08/04/14 0558  BP: 126/73  Pulse:   Temp: 37.4 C  Resp: 18    Complications: No apparent anesthesia complications

## 2014-08-04 NOTE — Consult Note (Signed)
Reason for Consult:Esophageal perforation Referring Physician: Dr. Elmon Else is an 59 y.o. male.  HPI: 45 y man who presented with a cc/o difficulty swallowing  64 Nepalese man presented yesterday with a cc/o difficulty swallowing and mild chest pain/ heartburn. He has had difficulty swallowing for the past year or two, but says it would come and go. This past Tuesday he noted difficulty swallowing that persisted. He finally came to the ED yesterday.  He had endoscopy by Dr. Collene Mares which demonstrated a retained foreign body in the esophagus. He does not recalled swallowing any bones in the days prior to admission. She was unable to extract the foreign body. A CT showed the foreign body in the distal esophagus.  Today he had the foreign body extracted with flexible esophagoscopy under general anesthesia. I was present at the time of extraction and there was a mucosal tear that appeared chronic noted after the body was removed. A follow up swallow demonstrated a small, contained distal esophageal leak.  He currently has "a little" chest pain. He is not febrile, tachycardic or tachypneic.  His son interpreted his answers to my questions.  Past Medical History  Diagnosis Date  . Hypertension     History reviewed. No pertinent past surgical history.  History reviewed. No pertinent family history.   Social History:  reports that he has never smoked. He does not have any smokeless tobacco history on file. He reports that he drinks alcohol. He reports that he does not use illicit drugs.  Allergies: No Known Allergies  Medications:  Scheduled: . pantoprazole (PROTONIX) IV  40 mg Intravenous Q24H  . piperacillin-tazobactam (ZOSYN)  IV  3.375 g Intravenous Q8H    Results for orders placed or performed during the hospital encounter of 08/03/14 (from the past 48 hour(s))  CBC with Differential/Platelet     Status: Abnormal   Collection Time: 08/03/14 10:44 AM  Result Value Ref Range    WBC 13.0 (H) 4.0 - 10.5 K/uL   RBC 5.32 4.22 - 5.81 MIL/uL   Hemoglobin 16.7 13.0 - 17.0 g/dL   HCT 48.0 39.0 - 52.0 %   MCV 90.2 78.0 - 100.0 fL   MCH 31.4 26.0 - 34.0 pg   MCHC 34.8 30.0 - 36.0 g/dL   RDW 14.0 11.5 - 15.5 %   Platelets 134 (L) 150 - 400 K/uL   Neutrophils Relative % 77 43 - 77 %   Neutro Abs 9.9 (H) 1.7 - 7.7 K/uL   Lymphocytes Relative 13 12 - 46 %   Lymphs Abs 1.7 0.7 - 4.0 K/uL   Monocytes Relative 8 3 - 12 %   Monocytes Absolute 1.0 0.1 - 1.0 K/uL   Eosinophils Relative 2 0 - 5 %   Eosinophils Absolute 0.3 0.0 - 0.7 K/uL   Basophils Relative 0 0 - 1 %   Basophils Absolute 0.0 0.0 - 0.1 K/uL  Comprehensive metabolic panel     Status: Abnormal   Collection Time: 08/03/14 10:44 AM  Result Value Ref Range   Sodium 135 135 - 145 mmol/L   Potassium 3.8 3.5 - 5.1 mmol/L   Chloride 100 96 - 112 mmol/L   CO2 27 19 - 32 mmol/L   Glucose, Bld 137 (H) 70 - 99 mg/dL   BUN 16 6 - 23 mg/dL   Creatinine, Ser 1.29 0.50 - 1.35 mg/dL   Calcium 9.2 8.4 - 10.5 mg/dL   Total Protein 7.8 6.0 - 8.3 g/dL   Albumin  3.6 3.5 - 5.2 g/dL   AST 27 0 - 37 U/L   ALT 22 0 - 53 U/L   Alkaline Phosphatase 51 39 - 117 U/L   Total Bilirubin 2.0 (H) 0.3 - 1.2 mg/dL   GFR calc non Af Amer 60 (L) >90 mL/min   GFR calc Af Amer 69 (L) >90 mL/min    Comment: (NOTE) The eGFR has been calculated using the CKD EPI equation. This calculation has not been validated in all clinical situations. eGFR's persistently <90 mL/min signify possible Chronic Kidney Disease.    Anion gap 8 5 - 15  Lipase, blood     Status: None   Collection Time: 08/03/14 10:44 AM  Result Value Ref Range   Lipase 27 11 - 59 U/L  I-stat troponin, ED     Status: None   Collection Time: 08/03/14 10:50 AM  Result Value Ref Range   Troponin i, poc 0.01 0.00 - 0.08 ng/mL   Comment 3            Comment: Due to the release kinetics of cTnI, a negative result within the first hours of the onset of symptoms does not rule  out myocardial infarction with certainty. If myocardial infarction is still suspected, repeat the test at appropriate intervals.   Glucose, capillary     Status: None   Collection Time: 08/03/14 10:34 PM  Result Value Ref Range   Glucose-Capillary 92 70 - 99 mg/dL  CBC     Status: Abnormal   Collection Time: 08/04/14  5:16 AM  Result Value Ref Range   WBC 12.6 (H) 4.0 - 10.5 K/uL   RBC 5.06 4.22 - 5.81 MIL/uL   Hemoglobin 15.4 13.0 - 17.0 g/dL   HCT 59.6 17.8 - 23.9 %   MCV 89.5 78.0 - 100.0 fL   MCH 30.4 26.0 - 34.0 pg   MCHC 34.0 30.0 - 36.0 g/dL   RDW 23.9 52.1 - 94.6 %   Platelets 131 (L) 150 - 400 K/uL  Basic metabolic panel     Status: Abnormal   Collection Time: 08/04/14  5:16 AM  Result Value Ref Range   Sodium 137 135 - 145 mmol/L   Potassium 4.0 3.5 - 5.1 mmol/L   Chloride 105 96 - 112 mmol/L   CO2 28 19 - 32 mmol/L   Glucose, Bld 86 70 - 99 mg/dL   BUN 15 6 - 23 mg/dL   Creatinine, Ser 6.99 0.50 - 1.35 mg/dL   Calcium 8.6 8.4 - 50.0 mg/dL   GFR calc non Af Amer 68 (L) >90 mL/min   GFR calc Af Amer 78 (L) >90 mL/min    Comment: (NOTE) The eGFR has been calculated using the CKD EPI equation. This calculation has not been validated in all clinical situations. eGFR's persistently <90 mL/min signify possible Chronic Kidney Disease.    Anion gap 4 (L) 5 - 15  Protime-INR     Status: Abnormal   Collection Time: 08/04/14  5:16 AM  Result Value Ref Range   Prothrombin Time 16.6 (H) 11.6 - 15.2 seconds   INR 1.33 0.00 - 1.49  Glucose, capillary     Status: None   Collection Time: 08/04/14  8:22 AM  Result Value Ref Range   Glucose-Capillary 87 70 - 99 mg/dL  Glucose, capillary     Status: None   Collection Time: 08/04/14  2:15 PM  Result Value Ref Range   Glucose-Capillary 88 70 - 99 mg/dL  Ct Abdomen Pelvis W Contrast  08/03/2014   ADDENDUM REPORT: 08/03/2014 19:01  ADDENDUM: After original interpretation, the case was discussed with Dr. Collene Mares. Dr. Collene Mares  recently performed endoscopy on the patient, noting a hard foreign body within the distal esophagus. Upon review, what was originally felt to represent contrast within the lumen of the distal esophagus corresponds to the foreign body noted by endoscopy. On sagittal and coronal reconstructed images, this appears to be a flat transversely oriented foreign body imbedded within the walls of the distal esophagus in the area of wall thickening. Exact type of foreign body cannot be determined, but this could represent an impacted bone or other calcified foreign body. This appears less likely to be a mass on reconstructed images.   Electronically Signed   By: Rolm Baptise M.D.   On: 08/03/2014 19:01   08/03/2014   CLINICAL DATA:  Upper abdominal pain. Dysphagia. Evaluate for mass at the GE junction.  EXAM: CT ABDOMEN AND PELVIS WITH CONTRAST  TECHNIQUE: Multidetector CT imaging of the abdomen and pelvis was performed using the standard protocol following bolus administration of intravenous contrast.  CONTRAST:  100 OMNIPAQUE IOHEXOL 300 MG/ML  SOLN  COMPARISON:  None.  FINDINGS: Dependent atelectasis in the lung bases. Heart is normal size. No effusions.  The distal esophageal wall appears thickened above the GE junction, measuring up to 13 mm in thickness. Cannot exclude esophageal mass.  Liver, gallbladder, spleen, pancreas, adrenals and kidneys are unremarkable. No renal stones or ureteral stones. No hydronephrosis. Urinary bladder is unremarkable.  Stomach, duodenal bulb and duodenal sweep are unremarkable. Appendix is visualized and is normal. No free fluid, free air or adenopathy. Aorta is normal caliber.  No acute bony abnormality or focal bone lesion.  IMPRESSION: Distal esophageal wall thickening. Cannot exclude esophageal mass/ neoplasm. Recommend further evaluation with endoscopy.  Bibasilar atelectasis.  Electronically Signed: By: Rolm Baptise M.D. On: 08/03/2014 18:05   Dg Esophagus  08/04/2014   CLINICAL  DATA:  Impacted ingested foreign body within the distal esophagus, extracted endoscopically earlier today. Evaluate for possible perforation  EXAM: ESOPHOGRAM/BARIUM SWALLOW  TECHNIQUE: Single contrast examination was performed using single contrast water soluble technique.  FLUOROSCOPY TIME:  1 min 20 seconds  COMPARISON:  Chest radiograph 08/18/2013  FINDINGS: There is focal extravasation of oral contrast from the distal esophagus and communication with the esophageal lumen on real-time imaging. Contrast passes without obstruction from the distal esophagus into the grossly normal-appearing stomach, although the stomach was not further evaluated.  IMPRESSION: Focal distal esophageal perforation. These results were called by telephone at the time of interpretation on 08/04/2014 at 3:28 pm to Dr. Juanita Craver , who verbally acknowledged these results.   Electronically Signed   By: Conchita Paris M.D.   On: 08/04/2014 15:30    Review of Systems  Constitutional: Negative for fever and chills.  Respiratory: Negative for cough and shortness of breath.   Cardiovascular: Positive for chest pain (mild).  Gastrointestinal: Negative for abdominal pain and blood in stool.       Difficulty swallowing  All other systems reviewed and are negative.  Blood pressure 144/86, pulse 78, temperature 98.7 F (37.1 C), temperature source Oral, resp. rate 19, height $RemoveBe'5\' 6"'QEhgmoZFA$  (1.676 m), weight 143 lb 4.8 oz (65 kg), SpO2 97 %. Physical Exam  Vitals reviewed. Constitutional: He is oriented to person, place, and time. He appears well-developed and well-nourished. No distress.  HENT:  Head: Normocephalic and atraumatic.  Eyes: EOM are normal.  Neck: Neck supple. No thyromegaly present.  No subq air  Cardiovascular: Normal rate, regular rhythm, normal heart sounds and intact distal pulses.   No murmur heard. Respiratory: Effort normal and breath sounds normal.  GI: Soft. There is no tenderness.  Musculoskeletal: He exhibits  no edema.  Lymphadenopathy:    He has no cervical adenopathy.  Neurological: He is alert and oriented to person, place, and time.  No focal deficit  Skin: Skin is dry.    Assessment/Plan: 59 yo man who earlier today had endoscopic retrieval of a retained foreign body from the distal esophagus. His swallow afterwards shows a small contained leak.  In my opinion based on the findings at esophagoscopy this is likely secondary to a small mucosal tear from the foreign body rather than trauma from the removal. Regardless he has a small contained tear, but is not toxic. He is afebrile, has only a mildly elevated WBC, is not having significant pain, tachycardia or tachypnea. In all likelihood, this can be managed conservatively and avoid surgery.  The mainstays of treatment are keeping him NPO and covering with broad spectrum antibiotics. I have ordered zosyn to start this evening. He is already NPO  Since his PO intake has been poor for several days and he will not be able to eat for awhile, I would suggest placing a PICC and starting TNA in the morning.  He will need a repeat swallow in 5-7 days   Melrose Nakayama 08/04/2014, 5:33 PM

## 2014-08-04 NOTE — Progress Notes (Signed)
Used pacific interpreter to have patient sign consent

## 2014-08-04 NOTE — Op Note (Signed)
[name] Daugherty [address] [place], [address]   OPERATIVE PROCEDURE REPORT  PATIENT :[name] Daugherty, [name] Daugherty  MR#: [id number] BIRTHDATE :14-Nov-1955 GENDER: male ENDOSCOPIST: Lorenza Burton, MD ASSISTANT:   Nilsa Nutting, Endo technician & Omelia Blackwater, RN PROCEDURE DATE: 08/31/2014 PRE-PROCEDURE PREPERATION: Patient fasted for 8 hours prior to the procedure. PRE-PROCEDURE PHYSICAL: Patient has stable vital signs.  Neck is supple.  There is no JVD, thyromegaly or LAD.  Chest clear to auscultation.  S1 and S2 regular.  Abdomen soft, non-distended, non-tender with NABS. PROCEDURE:     EGD with foreign body removal ASA CLASS:     Class II INDICATIONS:     Odynophagia, dysphagia, and history of esophageal reflux. MEDICATIONS:     General anesthesia-OR TOPICAL ANESTHETIC:   none used.  DESCRIPTION OF PROCEDURE: After the risks benefits and alternatives of the procedure were thoroughly explained, informed consent was obtained. The Pentax [id number] 3.8 [id number]  was introduced through the mouth and advanced to the second portion of the duodenum , without limitations. The instrument was slowly withdrawn as the mucosa was fully examined. Estimated blood loss is zero unless otherwise noted in this procedure report.      ESOPHAGUS: There was a large flat bone transversely lodged in the distal esophagus and after using multiple instruments like the Roth net, Raptor, Rat tooth forceps and the Talon grasper was used to securely grab it and removed it from the esophagus. LA Class D esophagitis (Mucosal breaks involving more than 75% of esophageal circumference) noted in the distal third of the esophagus. There was a deep ulcer noted just above the GEJ after the foreign body was removed. STOMACH: Patchy gastritis was noted in the proximal stomach; the rest of the gastric mucosa appeared normal. DUODENUM: The proximal duodenal mucosa showed no abnormalities. Retroflexed  views revealed no abnormalities. The scope was then withdrawn from the patient and the procedure terminated. The patient tolerated the procedure without immediate complications. Dr. Dorris Fetch was at the bedside dueing the procedure.  IMPRESSION:  1) Foreign body impacted in the distal esophagus removed by a Talon grasper; there was LA Class D esophagitis noted in the distal third of the esophagus. 2) Patchy gastritis was found  in the proximal stomach. 3) The proximal duodenal mucosa showed no abnormalities.  RECOMMENDATIONS:     1) Continue PPI's IV for now 2) Get a gastrograffin swallow ASAP. 3) NPO for now.  REPEAT EXAM:  In 8 weeks  for a repeat EGD.  DISCHARGE INSTRUCTIONS: standard instructions given.  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  The verification of the translation of this report to the ICD and CPT codes and modifiers is the sole responsibility of the health care institution and practicing physician where this report was generated.  [employer] will not be held responsible for the validity of the ICD and CPT codes included on this report.  AMA assumes no liability for data contained or not contained herein. CPT is a Publishing rights manager of the [employer].   CC:  PATIENT NAME:  [name] Daugherty, [name] Daugherty MR#: [id number]

## 2014-08-04 NOTE — Progress Notes (Signed)
Patient back from Endo. Alert and oriented x 4; no acute distress noted, no complaints. Family at bedside. Will continue to monitor.

## 2014-08-04 NOTE — Progress Notes (Signed)
Patient ID: [name] Simpsonek Mathia, male   DOB: 12/01/1955, 59 y.o.   MRN: [id number] [facility] PROGRESS NOTE  [name] Daugherty WUJ:[id number] DOB: 02/09/1956 DOA: 08/03/2014 PCP: No PCP Per Patient  Brief narrative:    59 year old male with no significant past medical history who [facility] with intermittent dysphagia. GI has seen the patient in consultation and performed EGD 08/03/2014 but could not have dislodged the foreign object and recommended surgical evaluation.    Assessment/Plan:    Active Problems:   Dysphagia secondary to foreign object  - CT abdomen showed distal esophageal wall thickening, cannot exclude mass/ neoplasm but in discussion with Dr. Loreta AveMann of GI after EGD was performed this seems to be foreign object rather than a mass - EGD unable to have object dislodged and removed so plan for surgical evaluation today - Keep NPO - Continue IV fluids    DVT Prophylaxis  - SCD's bilaterally    Code Status: Full.  Family Communication:  plan of care discussed with the patient Disposition Plan: needs surgery for foreign material removal, endo could not accomplish this. Not stable for discharge yet.    IV access:  Peripheral IV  Procedures and diagnostic studies:    Ct Abdomen Pelvis W Contrast 08/03/2014  After original interpretation, the case was discussed with Dr. Loreta AveMann. Dr. Loreta AveMann recently performed endoscopy on the patient, noting a hard foreign body within the distal esophagus. Upon review, what was originally felt to represent contrast within the lumen of the distal esophagus corresponds to the foreign body noted by endoscopy. On sagittal and coronal reconstructed images, this appears to be a flat transversely oriented foreign body imbedded within the walls of the distal esophagus in the area of wall thickening. Exact type of foreign body cannot be determined, but this could represent an impacted bone or other calcified foreign body. This appears less likely to be a mass on  reconstructed images.  Distal esophageal wall thickening. Cannot exclude esophageal mass/ neoplasm. Recommend further evaluation with endoscopy.  Bibasilar atelectasis.   Medical Consultants:  Gastroenterology  Other Consultants:  None   IAnti-Infectives:   None    Manson PasseyEVINE, Ivelise Castillo, MD  [facility] Pager [phone]  If 7PM-7AM, please contact night-coverage [email or web address] Password [email or web address] 08/04/2014, 10:26 AM   LOS: 1 day    HPI/Subjective: No acute overnight events.  Objective: Filed Vitals:   08/03/14 1700 08/03/14 1814 08/03/14 2234 08/04/14 0558  BP: 113/90 154/88 139/70 126/73  Pulse: 83 72 80   Temp:  98.7 F (37.1 C) 98.6 F (37 C) 99.3 F (37.4 C)  TempSrc:  Oral Oral Oral  Resp: 18 16 16 18   SpO2: 98% 99% 92% 94%    Intake/Output Summary (Last 24 hours) at 08/04/14 1026 Last data filed at 08/04/14 0919  Gross per 24 hour  Intake  757.5 ml  Output      0 ml  Net  757.5 ml    Exam:   General:  Pt is alert, follows commands appropriately, not in acute distress  Cardiovascular: Regular rate and rhythm, S1/S2 (+)  Respiratory: Clear to auscultation bilaterally, no wheezing, no crackles, no rhonchi  Abdomen: Soft, non tender, non distended, bowel sounds present  Extremities: No edema, pulses DP and PT palpable bilaterally  Neuro: Grossly nonfocal  Data Reviewed: Basic Metabolic Panel:  Recent Labs Lab 08/03/14 1044 08/04/14 0516  NA 135 137  K 3.8 4.0  CL 100 105  CO2 27 28  GLUCOSE 137* 86  BUN 16 15  CREATININE 1.29 1.16  CALCIUM 9.2 8.6   Liver Function Tests:  Recent Labs Lab 08/03/14 1044  AST 27  ALT 22  ALKPHOS 51  BILITOT 2.0*  PROT 7.8  ALBUMIN 3.6    Recent Labs Lab 08/03/14 1044  LIPASE 27   No results for input(s): AMMONIA in the last 168 hours. CBC:  Recent Labs Lab 08/03/14 1044 08/04/14 0516  WBC 13.0* 12.6*  NEUTROABS 9.9*  --   HGB 16.7 15.4  HCT 48.0 45.3  MCV 90.2 89.5  PLT 134* 131*    Cardiac Enzymes: No results for input(s): CKTOTAL, CKMB, CKMBINDEX, TROPONINI in the last 168 hours. BNP: Invalid input(s): POCBNP CBG:  Recent Labs Lab 08/03/14 2234 08/04/14 0822  GLUCAP 92 87    No results found for this or any previous visit (from the past 240 hour(s)).   Scheduled Meds: . pantoprazole (PROTONIX) IV  40 mg Intravenous Q24H   Continuous Infusions: . sodium chloride 20 mL/hr at 08/03/14 2000  . sodium chloride 1,000 mL (08/04/14 0751)

## 2014-08-04 NOTE — Anesthesia Preprocedure Evaluation (Signed)
Anesthesia Evaluation  Patient identified by MRN, date of birth, ID band Patient awake    Reviewed: Allergy & Precautions, NPO status , Patient's Chart, lab work & pertinent test results  Airway Mallampati: II  TM Distance: >3 FB Neck ROM: Full    Dental no notable dental hx.    Pulmonary neg pulmonary ROS,  breath sounds clear to auscultation  Pulmonary exam normal       Cardiovascular hypertension, Rhythm:Regular Rate:Normal     Neuro/Psych negative neurological ROS  negative psych ROS   GI/Hepatic negative GI ROS, Neg liver ROS,   Endo/Other  negative endocrine ROS  Renal/GU negative Renal ROS  negative genitourinary   Musculoskeletal negative musculoskeletal ROS (+)   Abdominal   Peds negative pediatric ROS (+)  Hematology negative hematology ROS (+)   Anesthesia Other Findings   Reproductive/Obstetrics negative OB ROS                             Anesthesia Physical Anesthesia Plan  ASA: II  Anesthesia Plan: General   Post-op Pain Management:    Induction: Intravenous and Rapid sequence  Airway Management Planned: Oral ETT  Additional Equipment:   Intra-op Plan:   Post-operative Plan: Extubation in OR  Informed Consent: I have reviewed the patients History and Physical, chart, labs and discussed the procedure including the risks, benefits and alternatives for the proposed anesthesia with the patient or authorized representative who has indicated his/her understanding and acceptance.   Dental advisory given  Plan Discussed with: CRNA and Surgeon  Anesthesia Plan Comments:         Anesthesia Quick Evaluation

## 2014-08-04 NOTE — Anesthesia Postprocedure Evaluation (Signed)
  Anesthesia Post-op Note  Patient: [name] Daugherty  Procedure(s) Performed: Procedure(s): FOREIGN BODY REMOVAL ( ESOPHAGEAL MASS ) (N/A)  Patient Location: PACU  Anesthesia Type:General  Level of Consciousness: oriented, sedated, patient cooperative and responds to stimulation  Airway and Oxygen Therapy: Patient Spontanous Breathing and Patient connected to nasal cannula oxygen  Post-op Pain: none  Post-op Assessment: Post-op Vital signs reviewed, Patient's Cardiovascular Status Stable, Respiratory Function Stable, Patent Airway and No signs of Nausea or vomiting  Post-op Vital Signs: Reviewed and stable  Last Vitals:  Filed Vitals:   08/04/14 1245  BP: 126/79  Pulse: 80  Temp:   Resp: 19    Complications: No apparent anesthesia complications

## 2014-08-05 MED ORDER — SODIUM CHLORIDE 0.9 % IJ SOLN
10.0000 mL | INTRAMUSCULAR | Status: DC | PRN
  Administered 2014-08-06 (×2): 20 mL
  Administered 2014-08-07: 10 mL
  Filled 2014-08-05 (×3): qty 40

## 2014-08-05 NOTE — Progress Notes (Signed)
1 Day Post-Op Procedure(s) (LRB): FOREIGN BODY REMOVAL ( ESOPHAGEAL MASS ) (N/A) Subjective: Still having some mild chest discomfort  Objective: Vital signs in last 24 hours: Temp:  [97.9 F (36.6 C)-98.7 F (37.1 C)] 97.9 F (36.6 C) (03/27 0630) Pulse Rate:  [56-82] 56 (03/27 0630) Cardiac Rhythm:  [-] Normal sinus rhythm (03/26 1300) Resp:  [15-19] 16 (03/27 0630) BP: (116-144)/(69-86) 133/69 mmHg (03/27 0630) SpO2:  [96 %-100 %] 98 % (03/27 0630)  Hemodynamic parameters for last 24 hours:    Intake/Output from previous day: 03/26 0701 - 03/27 0700 In: 2868.8 [I.V.:2768.8; IV Piggyback:100] Out: -  Intake/Output this shift:    General appearance: alert Heart: regular rate and rhythm Lungs: clear to auscultation bilaterally Abdomen: benign  Lab Results:  Recent Labs  08/03/14 1044 08/04/14 0516  WBC 13.0* 12.6*  HGB 16.7 15.4  HCT 48.0 45.3  PLT 134* 131*   BMET:  Recent Labs  08/03/14 1044 08/04/14 0516  NA 135 137  K 3.8 4.0  CL 100 105  CO2 27 28  GLUCOSE 137* 86  BUN 16 15  CREATININE 1.29 1.16  CALCIUM 9.2 8.6    PT/INR:  Recent Labs  08/04/14 0516  LABPROT 16.6*  INR 1.33   ABG No results found for: PHART, HCO3, TCO2, ACIDBASEDEF, O2SAT CBG (last 3)   Recent Labs  08/04/14 0822 08/04/14 1415 08/04/14 1740  GLUCAP 87 88 123*    Assessment/Plan: S/P Procedure(s) (LRB): FOREIGN BODY REMOVAL ( ESOPHAGEAL MASS ) (N/A) -  Contained perforation of esophagus due to retained foreign body He is not toxic Continue IV antibiotics Strict NPO Will need TNA Repeat swallow Thursday or Friday   LOS: 2 days    Loreli SlotSteven C Italo Banton 08/05/2014

## 2014-08-05 NOTE — Progress Notes (Addendum)
Patient ID: [name] Daugherty Monterroso, male   DOB: 03/04/1956, 59 y.o.   MRN: [id number] [facility] PROGRESS NOTE  [name] Daugherty Fooks WUJ:[id number] DOB: 04/21/1956 DOA: 08/03/2014 PCP: No PCP Per Patient  Brief narrative:    59 year old male with no significant past medical history who [facility] with [facility] dysphagia. GI has seen the patient in consultation and performed EGD 08/03/2014 but could not have dislodged the foreign object and recommended surgical evaluation.    Assessment/Plan:    Active Problems:   Dysphagia secondary to foreign object  - CT abdomen showed distal esophageal wall thickening, ? mass/ neoplasm/ ? foreign object rather than a mass - EGD unable to have object dislodged and removed - now status post removal of the foreign object by Dr. Dorris FetchHendrickson - contained perforation of esophagus noted due to retained foreign body  - recommendation is to keep pt strict NPO, continue IV ABX, will need TNA, also continue IVF  - repeat gastrograffin swallow study Thursday 3/31     DVT Prophylaxis  - SCD's bilaterally   Code Status: Full.  Family Communication:  plan of care discussed with the patient Disposition Plan: not ready for discharge, plan to start TNA, repeat swallow study 3/31  IV access:  Peripheral IV PICC line to be placed 3/27  Procedures and diagnostic studies:    Ct Abdomen Pelvis W Contrast 08/03/2014  Distal esophageal wall thickening. Cannot exclude esophageal mass/ neoplasm. Recommend further evaluation with endoscopy.  Bibasilar atelectasis.   Medical Consultants:  Gastroenterology - Dr. Loreta AveMann  CTS - Dr. Dorris FetchHendrickson   Other Consultants:  None   IAnti-Infectives:   Zosyn 3/26 -->   Debbora PrestoMAGICK-Harvie Morua, MD  [facility] Pager [phone]  If 7PM-7AM, please contact night-coverage [email or web address] Password [email or web address] 08/05/2014, 1:23 PM   LOS: 2 days    HPI/Subjective: No acute overnight events.  Objective: Filed Vitals:   08/04/14 1416  08/04/14 1538 08/04/14 2148 08/05/14 0630  BP: 116/73 144/86 121/73 133/69  Pulse: 82 78 68 56  Temp: 98.4 F (36.9 C) 98.7 F (37.1 C) 98.4 F (36.9 C) 97.9 F (36.6 C)  TempSrc: Oral Oral Oral Oral  Resp: 18 19 15 16   Height:      Weight:      SpO2: 96% 97% 97% 98%    Intake/Output Summary (Last 24 hours) at 08/05/14 1323 Last data filed at 08/05/14 0926  Gross per 24 hour  Intake 1868.75 ml  Output      0 ml  Net 1868.75 ml    Exam:   General:  Pt is alert, follows commands appropriately, not in acute distress  Cardiovascular: Regular rhythm, bradycardia, S1/S2 (+)  Respiratory: Clear to auscultation bilaterally, no wheezing, no crackles, no rhonchi  Abdomen: Soft, non tender, non distended, bowel sounds present  Extremities: No edema, pulses DP and PT palpable bilaterally  Neuro: Grossly nonfocal  Data Reviewed: Basic Metabolic Panel:  Recent Labs Lab 08/03/14 1044 08/04/14 0516  NA 135 137  K 3.8 4.0  CL 100 105  CO2 27 28  GLUCOSE 137* 86  BUN 16 15  CREATININE 1.29 1.16  CALCIUM 9.2 8.6   Liver Function Tests:  Recent Labs Lab 08/03/14 1044  AST 27  ALT 22  ALKPHOS 51  BILITOT 2.0*  PROT 7.8  ALBUMIN 3.6    Recent Labs Lab 08/03/14 1044  LIPASE 27   CBC:  Recent Labs Lab 08/03/14 1044 08/04/14 0516  WBC 13.0* 12.6*  NEUTROABS 9.9*  --  HGB 16.7 15.4  HCT 48.0 45.3  MCV 90.2 89.5  PLT 134* 131*   CBG:  Recent Labs Lab 08/03/14 2234 08/04/14 0822 08/04/14 1415 08/04/14 1740  GLUCAP 92 87 88 123*    Scheduled Meds: . pantoprazole (PROTONIX) IV  40 mg Intravenous Q24H  . piperacillin-tazobactam (ZOSYN)  IV  3.375 g Intravenous Q8H   Continuous Infusions: . sodium chloride 1,000 mL (08/05/14 0947)

## 2014-08-05 NOTE — Progress Notes (Signed)
Peripherally Inserted Central Catheter/Midline Placement  The IV Nurse has discussed with the patient and/or persons authorized to consent for the patient, the purpose of this procedure and the potential benefits and risks involved with this procedure.  The benefits include less needle sticks, lab draws from the catheter and patient may be discharged home with the catheter.  Risks include, but not limited to, infection, bleeding, blood clot (thrombus formation), and puncture of an artery; nerve damage and irregular heat beat.  Alternatives to this procedure were also discussed.  Consent obtained via interpreter line.  Son also spoke with interpreter.  All questions answered.  Pt able to sign consent.  PICC/Midline Placement Documentation  PICC / Midline Double Lumen 08/05/14 PICC Right Basilic 42 cm 0 cm (Active)  Indication for Insertion or Continuance of Line Administration of hyperosmolar/irritating solutions (i.e. TPN, Vancomycin, etc.) 08/05/2014  4:56 PM  Exposed Catheter (cm) 0 cm 08/05/2014  4:56 PM  Site Assessment Clean;Dry;Intact 08/05/2014  4:56 PM  Lumen #1 Status Flushed;Saline locked;Blood return noted 08/05/2014  4:56 PM  Lumen #2 Status Flushed;Saline locked;Blood return noted 08/05/2014  4:56 PM  Dressing Type Transparent 08/05/2014  4:56 PM  Dressing Status Clean;Dry;Intact;Antimicrobial disc in place 08/05/2014  4:56 PM  Line Care Connections checked and tightened 08/05/2014  4:56 PM  Line Adjustment (NICU/IV Team Only) No 08/05/2014  4:56 PM  Dressing Intervention New dressing 08/05/2014  4:56 PM  Dressing Change Due 08/12/14 08/05/2014  4:56 PM       Elliot Dallyiggs, Arbie Blankley Wright 08/05/2014, 4:57 PM

## 2014-08-05 NOTE — Progress Notes (Signed)
Subjective: Since I last evaluated the patient, he seems to be doing somewhat better. He has some mild chest discomfort that he rates at 4/10. He has very little understanding of the finding since endoscopy. His son is at his bedside and I have had a detailed discussion with him. Dr. Sunday Corn help is greatly appreciated.   Objective: Vital signs in last 24 hours: Temp:  [97.9 F (36.6 C)-98.7 F (37.1 C)] 97.9 F (36.6 C) (03/27 0630) Pulse Rate:  [56-82] 56 (03/27 0630) Resp:  [15-19] 16 (03/27 0630) BP: (116-144)/(69-86) 133/69 mmHg (03/27 0630) SpO2:  [96 %-100 %] 98 % (03/27 0630) Weight:  [65 kg (143 lb 4.8 oz)] 65 kg (143 lb 4.8 oz) (03/26 1117) Last BM Date:  (PTA)  Intake/Output from previous day: 03/26 0701 - 03/27 0700 In: 2868.8 [I.V.:2768.8; IV Piggyback:100] Out: -  Intake/Output this shift:   General appearance: alert, cooperative, appears stated age and no distress Resp: clear to auscultation bilaterally Cardio: regular rate and rhythm, S1, S2 normal, no murmur, click, rub or gallop GI: soft, non-tender; bowel sounds normal; no masses,  no organomegaly Extremities: extremities normal, atraumatic, no cyanosis or edema  Lab Results:  Recent Labs  08/03/14 1044 08/04/14 0516  WBC 13.0* 12.6*  HGB 16.7 15.4  HCT 48.0 45.3  PLT 134* 131*   BMET  Recent Labs  08/03/14 1044 08/04/14 0516  NA 135 137  K 3.8 4.0  CL 100 105  CO2 27 28  GLUCOSE 137* 86  BUN 16 15  CREATININE 1.29 1.16  CALCIUM 9.2 8.6   LFT  Recent Labs  08/03/14 1044  PROT 7.8  ALBUMIN 3.6  AST 27  ALT 22  ALKPHOS 51  BILITOT 2.0*   PT/INR  Recent Labs  08/04/14 0516  LABPROT 16.6*  INR 1.33   Studies/Results: Ct Abdomen Pelvis W Contrast  08/03/2014   ADDENDUM REPORT: 08/03/2014 19:01  ADDENDUM: After original interpretation, the case was discussed with Dr. Loreta Ave. Dr. Loreta Ave recently performed endoscopy on the patient, noting a hard foreign body within the distal  esophagus. Upon review, what was originally felt to represent contrast within the lumen of the distal esophagus corresponds to the foreign body noted by endoscopy. On sagittal and coronal reconstructed images, this appears to be a flat transversely oriented foreign body imbedded within the walls of the distal esophagus in the area of wall thickening. Exact type of foreign body cannot be determined, but this could represent an impacted bone or other calcified foreign body. This appears less likely to be a mass on reconstructed images.   Electronically Signed   By: Charlett Nose M.D.   On: 08/03/2014 19:01   08/03/2014   CLINICAL DATA:  Upper abdominal pain. Dysphagia. Evaluate for mass at the GE junction.  EXAM: CT ABDOMEN AND PELVIS WITH CONTRAST  TECHNIQUE: Multidetector CT imaging of the abdomen and pelvis was performed using the standard protocol following bolus administration of intravenous contrast.  CONTRAST:  100 OMNIPAQUE IOHEXOL 300 MG/ML  SOLN  COMPARISON:  None.  FINDINGS: Dependent atelectasis in the lung bases. Heart is normal size. No effusions.  The distal esophageal wall appears thickened above the GE junction, measuring up to 13 mm in thickness. Cannot exclude esophageal mass.  Liver, gallbladder, spleen, pancreas, adrenals and kidneys are unremarkable. No renal stones or ureteral stones. No hydronephrosis. Urinary bladder is unremarkable.  Stomach, duodenal bulb and duodenal sweep are unremarkable. Appendix is visualized and is normal. No free fluid, free air  or adenopathy. Aorta is normal caliber.  No acute bony abnormality or focal bone lesion.  IMPRESSION: Distal esophageal wall thickening. Cannot exclude esophageal mass/ neoplasm. Recommend further evaluation with endoscopy.  Bibasilar atelectasis.  Electronically Signed: By: Charlett NoseKevin  Dover M.D. On: 08/03/2014 18:05   Dg Esophagus  08/04/2014   CLINICAL DATA:  Impacted ingested foreign body within the distal esophagus, extracted endoscopically  earlier today. Evaluate for possible perforation  EXAM: ESOPHOGRAM/BARIUM SWALLOW  TECHNIQUE: Single contrast examination was performed using single contrast water soluble technique.  FLUOROSCOPY TIME:  1 min 20 seconds  COMPARISON:  Chest radiograph 08/18/2013  FINDINGS: There is focal extravasation of oral contrast from the distal esophagus and communication with the esophageal lumen on real-time imaging. Contrast passes without obstruction from the distal esophagus into the grossly normal-appearing stomach, although the stomach was not further evaluated.  IMPRESSION: Focal distal esophageal perforation. These results were called by telephone at the time of interpretation on 08/04/2014 at 3:28 pm to Dr. Charna ElizabethJYOTHI Fishel Wamble , who verbally acknowledged these results.   Electronically Signed   By: Christiana PellantGretchen  Green M.D.   On: 08/04/2014 15:30   Medications: I have reviewed the patient's current medications.  Assessment/Plan: Focal distal esophageal perforation after removal of a sharp bone from the distal esophagus that was lodged transversely-severe distal esophagitis-patient seems to be stable; will continue IV PPI's and Zosyn. Will get a PICC line placed today. A repeat gastrograffin swallow will be done on 08/09/14.  LOS: 2 days   Henning Ehle 08/05/2014, 11:05 AM

## 2014-08-05 NOTE — Progress Notes (Signed)
PARENTERAL NUTRITION CONSULT NOTE - INITIAL  Pharmacy Consult:  TPN Indication:    No Known Allergies  Patient Measurements: Height: 5\' 6"  (167.6 cm) Weight: 143 lb 4.8 oz (65 kg) IBW/kg (Calculated) : 63.8  Vital Signs: Temp: 98.3 F (36.8 C) (03/27 1348) Temp Source: Oral (03/27 1348) BP: 135/86 mmHg (03/27 1348) Pulse Rate: 56 (03/27 0630) Intake/Output from previous day: 03/26 0701 - 03/27 0700 In: 2868.8 [I.V.:2768.8; IV Piggyback:100] Out: -   Labs:  Recent Labs  08/03/14 1044 08/04/14 0516  WBC 13.0* 12.6*  HGB 16.7 15.4  HCT 48.0 45.3  PLT 134* 131*  INR  --  1.33     Recent Labs  08/03/14 1044 08/04/14 0516  NA 135 137  K 3.8 4.0  CL 100 105  CO2 27 28  GLUCOSE 137* 86  BUN 16 15  CREATININE 1.29 1.16  CALCIUM 9.2 8.6  PROT 7.8  --   ALBUMIN 3.6  --   AST 27  --   ALT 22  --   ALKPHOS 51  --   BILITOT 2.0*  --    Estimated Creatinine Clearance: 62.6 mL/min (by C-G formula based on Cr of 1.16).    Recent Labs  08/04/14 0822 08/04/14 1415 08/04/14 1740  GLUCAP 87 88 123*    Medical History: Past Medical History  Diagnosis Date  . Hypertension       Insulin Requirements in the past 24 hours:  None - not yet on SSI  Assessment: [age] YOM presented on 08/03/14 with swallowing difficulty and epigastric pain for 3 days PTA.  Endoscopy revealed a friable area with bleeding and foreign body with possible obstructing mass.  Repeat EGD showed foreign body impacted in the distal esophagus and it was removed on 08/04/14.  He also has a mucosal tear that was noted after surgery.  Swallow study showed distal esophageal leak with plan to repeat gastrograffin study on 08/09/14.  Granddaughter translated for patient and stated he was eating regularly PTA.  GI: esophageal perforation, albumin 3.6 - PPI IV Endo: no hx DM - CBGs 87-123 Lytes: WNL Renal: SCr 1.16, CrCL 63 ml/min Pulm: stable on RA Cards: HTN - VSS Hepatobil: LFTs WNL, tbili mildly  elevated at 2 Neuro: A&O ID: Zosyn D#2 (3/26 >> ) as empiric coverage, afebrile, WBC 12.6 Best Practices: SCDs  TPN Access: PICC to be placed 3/27 TPN day#: 0 (3/27 >> )  Current Nutrition:  NPO  Nutritional Goals:  1600-1700 kCal, 75-90 grams of protein per day   Plan:  - Paged Dr. Izola PriceMyers twice without response.  TPN is currently not indicated as patient's diet was normal PTA without any significant concern for malnutrition, and GI tract is intact.   - Highly recommend waiting 7 days and reassess after gastrograffin study scheduled on 08/09/14 (today is day #3 of NPO status).  Favor enteral over parenteral nutrition. - Will not start TPN today and f/u with MD in AM.    Millissa Deese D. Laney Potashang, PharmD, BCPS Pager:  [phone] 08/05/2014, 2:39 PM

## 2014-08-06 ENCOUNTER — Encounter (HOSPITAL_COMMUNITY): Payer: Self-pay | Admitting: Gastroenterology

## 2014-08-06 DIAGNOSIS — D72829 Elevated white blood cell count, unspecified: Secondary | ICD-10-CM

## 2014-08-06 DIAGNOSIS — K223 Perforation of esophagus: Secondary | ICD-10-CM

## 2014-08-06 DIAGNOSIS — T18198S Other foreign object in esophagus causing other injury, sequela: Secondary | ICD-10-CM

## 2014-08-06 LAB — CBC
HEMATOCRIT: 46 % (ref 39.0–52.0)
HEMOGLOBIN: 16 g/dL (ref 13.0–17.0)
MCH: 30.8 pg (ref 26.0–34.0)
MCHC: 34.8 g/dL (ref 30.0–36.0)
MCV: 88.6 fL (ref 78.0–100.0)
Platelets: 157 10*3/uL (ref 150–400)
RBC: 5.19 MIL/uL (ref 4.22–5.81)
RDW: 13.7 % (ref 11.5–15.5)
WBC: 10.5 10*3/uL (ref 4.0–10.5)

## 2014-08-06 LAB — BASIC METABOLIC PANEL
ANION GAP: 10 (ref 5–15)
BUN: 24 mg/dL — ABNORMAL HIGH (ref 6–23)
CO2: 23 mmol/L (ref 19–32)
Calcium: 8.5 mg/dL (ref 8.4–10.5)
Chloride: 106 mmol/L (ref 96–112)
Creatinine, Ser: 1.33 mg/dL (ref 0.50–1.35)
GFR calc Af Amer: 67 mL/min — ABNORMAL LOW (ref >90)
GFR, EST NON AFRICAN AMERICAN: 57 mL/min — AB (ref >90)
Glucose, Bld: 86 mg/dL (ref 70–99)
Potassium: 4.3 mmol/L (ref 3.5–5.1)
SODIUM: 139 mmol/L (ref 135–145)

## 2014-08-06 LAB — GLUCOSE, CAPILLARY
Glucose-Capillary: 77 mg/dL (ref 70–99)
Glucose-Capillary: 72 mg/dL (ref 70–99)

## 2014-08-06 MED ORDER — PANTOPRAZOLE SODIUM 40 MG IV SOLR
40.0000 mg | INTRAVENOUS | Status: DC
  Administered 2014-08-06 – 2014-08-09 (×4): 40 mg via INTRAVENOUS
  Filled 2014-08-06 (×5): qty 40

## 2014-08-06 NOTE — Progress Notes (Signed)
      [address]       [place] [id number]             [phone]      Still has "a little" epigastric pain  venous stasis dermatitis   Intake/Output Summary (Last 24 hours) at 08/06/14 1333 Last data filed at 08/06/14 [id number]  Gross per 24 hour  Intake   1770 ml  Output      0 ml  Net   1770 ml    WBC= 10K trending down  Lungs clear, Abdominal exam benign  Continue conservative management He will need a repeat swallow on Thursday. There is no guarantee that he will be able to eat at that time, and this could take several weeks to resolve. Not a candidate for tube feeding!  Salvatore DecentSteven C. Dorris FetchHendrickson, MD [facility] [phone]

## 2014-08-06 NOTE — Progress Notes (Signed)
Unassigned patient Subjective: Since I last evaluated the patient, he seems to be doing fairly well. He has had some epigastric pain and burning today. He is very thirsty and is requesting some fluids to drink. No nausea, vomiting, fever or chills.   Objective: Vital signs in last 24 hours: Temp:  [98 F (36.7 C)-99.3 F (37.4 C)] 98.1 F (36.7 C) (03/28 1546) Pulse Rate:  [46-69] 69 (03/28 1546) Resp:  [18-20] 20 (03/28 1546) BP: (108-154)/(69-81) 136/81 mmHg (03/28 1546) SpO2:  [96 %-100 %] 100 % (03/28 1546) Last BM Date:  (PTA)  Intake/Output from previous day: 03/27 0701 - 03/28 0700 In: 1770 [I.V.:1720; IV Piggyback:50] Out: -  Intake/Output this shift:   General appearance: alert, cooperative, appears stated age and no distress Resp: clear to auscultation bilaterally Cardio: regular rate and rhythm, S1, S2 normal, no murmur, click, rub or gallop GI: soft, minimal epigastric tenderness on palpation with no gaurding, rebound or rigidity; bowel sounds normal; no masses,  no organomegaly Extremities: extremities normal, atraumatic, no cyanosis or edema  Lab Results:  Recent Labs  08/04/14 0516 08/06/14 0435  WBC 12.6* 10.5  HGB 15.4 16.0  HCT 45.3 46.0  PLT 131* 157   BMET  Recent Labs  08/04/14 0516 08/06/14 0435  NA 137 139  K 4.0 4.3  CL 105 106  CO2 28 23  GLUCOSE 86 86  BUN 15 24*  CREATININE 1.16 1.33  CALCIUM 8.6 8.5   PT/INR  Recent Labs  08/04/14 0516  LABPROT 16.6*  INR 1.33   Medications: I have reviewed the patient's current medications.  Assessment/Plan: Grade IV esophagitis with focal esophageal perforation after removal of a foreign body-the importance of staying NPO has been discussed with the patient and his son in great details. I have filled out FMLA forms from his HR department; PICC line is in place. HE CANNOT have tube feedings with a focal esophageal perforation. This is NOT an option. Coninue PPI's.  LOS: 3 days    Harlo Fabela 08/06/2014, 4:47 PM

## 2014-08-06 NOTE — Progress Notes (Signed)
INITIAL NUTRITION ASSESSMENT  DOCUMENTATION CODES Per approved criteria  -Not Applicable   INTERVENTION: -RD to follow for diet advancement -Supplement diet as appropriate -Follow for initiation for nutrition support- recommend starting enteral nutrition- consider Initiate Jevity 1.2 @ 20 ml/hr and increase by 10 ml every 4 hours to goal rate of 65 ml/hr.  Tube feeding regimen provides 1872 kcal (100% of needs), 87 grams of protein, and 1259 ml of H2O. -Consider initiation of TPN if unable to obtain enteral access    NUTRITION DIAGNOSIS: Inadequate oral intake related to inability to eat as evidenced by NPO.   Goal: Pt will meet >90% of estimated nutritional needs  Monitor:  Nutrition support initiation, diet advancement, PO intake, labs, weight changes, I/O's  Reason for Assessment: Consult for new TPN, MST=3  59 y.o. male  Admitting Dx: <principal problem not specified>  Hosie Sharman is a 59 y.o. male of Nepali descent with no significant PMH presents to the ER with intermittent dysphagia for the last 2years, this worsened in the last week and now noted to have dysphagia with solids and liquids.  ASSESSMENT: Pt admitted with dysphagia secondary to foreign object. S/p Procedure(s) (LRB) on 08/04/14: FOREIGN BODY REMOVAL ( ESOPHAGEAL MASS ) (N/A)  Pt was asleep at time of visit. No signs of fat or muscle depletion noted.  Pt with esophageal perforation, with plans for strict NPO. Pt for repeat gastrograffin study on 08/09/14. CT of abdomen revealed esophageal wall thickening, further testing to r/o esophageal mass.  Spoke with pharmacist who reports that she received consult for TPN yesterday, however, did not start TPN due to pt with functioning GI tract. Her recommendation is to wait 7 days due to well-nourished status PTA. Per chart review, pt with stable wt and good appetite PTA.  Spoke with RN who confirmed above diagnosis. She reports that plan is for TPN as MD is potentially  concerned about placing NGT due to esophageal perforation.  Labs reviewed. BUN: 24. CBGS: 77-193.  Height: Ht Readings from Last 1 Encounters:  08/04/14  (1.676 m)    Weight: Wt Readings from Last 1 Encounters:  08/04/14 143 lb 4.8 oz (65 kg)    Ideal Body Weight: 142#  % Ideal Body Weight: 101%  Wt Readings from Last 10 Encounters:  08/04/14 143 lb 4.8 oz (65 kg)    Usual Body Weight: 143#  % Usual Body Weight: 143%  BMI:  Body mass index is 23.14 kg/(m^2). Normal weight range  Estimated Nutritional Needs: Kcal: 1750-1950 Protein: 80-90 grams Fluid: 1.8-2.0 L  Skin: WDL  Diet Order: Diet NPO time specified  EDUCATION NEEDS: -Education not appropriate at this time   Intake/Output Summary (Last 24 hours) at 08/06/14 1440 Last data filed at 08/06/14 1342  Gross per 24 hour  Intake   1770 ml  Output      0 ml  Net   1770 ml    Last BM: PTA  Labs:   Recent Labs Lab 08/03/14 1044 08/04/14 0516 08/06/14 0435  NA 135 137 139  K 3.8 4.0 4.3  CL 100 105 106  CO2 BUN 16 15 24*  CREATININE 1.29 1.16 1.33  CALCIUM 9.2 8.6 8.5  GLUCOSE 137* 86 86    CBG (last 3)   Recent Labs  08/04/14 1415 08/04/14 1740 08/06/14 1358  GLUCAP 88 123* 77    Scheduled Meds: . pantoprazole (PROTONIX) IV  40 mg Intravenous Q24H  . piperacillin-tazobactam (ZOSYN)  IV  3.375 g Intravenous Q8H    Continuous Infusions: . sodium chloride 1,000 mL (08/05/14 0947)    Past Medical History  Diagnosis Date  . Hypertension     Past Surgical History  Procedure Laterality Date  . Esophagogastroduodenoscopy N/A 08/04/2014    Procedure: FOREIGN BODY REMOVAL ( ESOPHAGEAL MASS );  Surgeon: Charna ElizabethJyothi Mann, MD;  Location: [facility] OR;  Service: Endoscopy;  Laterality: N/A;  . Esophagogastroduodenoscopy Left 08/03/2014    Procedure: ESOPHAGOGASTRODUODENOSCOPY (EGD);  Surgeon: Charna ElizabethJyothi Mann, MD;  [facility];  Service: Endoscopy;  Laterality: Left;    Kierstan Auer  A. Mayford KnifeWilliams, RD, LDN, CDE Pager: [phone] After hours Pager: [phone]

## 2014-08-06 NOTE — Progress Notes (Signed)
Pharmacy: TPN consult TPN not started yesterday. TPN is currently not indicated as patient's diet was normal PTA without any significant concern for malnutrition, and GI tract is intact. Baseline albumin 3.6. - Highly recommend waiting 7 days and reassess after gastrograffin study scheduled on 08/09/14 (today is day #4 of NPO status). Favor enteral over parenteral nutrition. Plan: I called and d/w Dr. Elisabeth Pigeonevine.  Will keep NPO for 7 days and repeat swallow study and re-evaluate nutrition source.  Herby AbrahamMichelle T. Nawaf Strange, Pharm.D. [phone] 08/06/2014 12:08 PM

## 2014-08-06 NOTE — Progress Notes (Signed)
Utilization review completed. Salvador Coupe, RN, BSN. 

## 2014-08-06 NOTE — Progress Notes (Signed)
Patient complaining of 10/10 pain "chest tightness" 12 lead EKG done. MD Elisabeth Pigeonevine notified.

## 2014-08-06 NOTE — Progress Notes (Signed)
Patient ID: [name] Daugherty, male   DOB: 12/10/1955, 59 y.o.   MRN: [id number] [facility] PROGRESS NOTE  [name] Daugherty OZH:[id number] DOB: 04/06/1956 DOA: 08/03/2014 PCP: No PCP Per Patient  Brief narrative:    59 year old male with no significant past medical history who [facility]. GI has seen the patient in consultation and performed EGD 08/03/2014 but could not have dislodged the foreign object and recommended surgical evaluation.   Assessment/Plan:    Active Problems: Dysphagia secondary to foreign object / esophageal perforation / Leukocytosis - CT abdomen showed distal esophageal wall thickening, ? mass/ neoplasm/ ? foreign object rather than a mass - EGD unable to have object dislodged and removed - Status post removal of the foreign object by Dr. Dorris FetchHendrickson with subsequent esophageal perforation  - Started zosyn 08/04/2014 - Repeat gastrograffin swallow study Thursday 3/31    DVT Prophylaxis  - SCD's bilaterally   Code Status: Full.  Family Communication: plan of care discussed with the patient and his family at the bedside  Disposition Plan: not ready for discharge, on IV zosyn, still NPO   IV access:  Peripheral IV  Procedures and diagnostic studies:    Ct Abdomen Pelvis W Contrast 08/03/2014 Distal esophageal wall thickening. Cannot exclude esophageal mass/ neoplasm. Recommend further evaluation with endoscopy. Bibasilar atelectasis. 08/03/2014    Distal esophageal wall thickening. Cannot exclude esophageal mass/ neoplasm. Recommend further evaluation with endoscopy.  Bibasilar atelectasis.  Electronically Signed: By: Charlett NoseKevin  Dover M.D. On: 08/03/2014 18:05   Dg Esophagus 08/04/2014   Focal distal esophageal perforation. These results were called by telephone at the time of interpretation on 08/04/2014 at 3:28 pm to Dr. Charna ElizabethJYOTHI MANN , who verbally acknowledged these results.      Medical Consultants:  Gastroenterology - Dr. Loreta AveMann  CTS  - Dr. Dorris FetchHendrickson   Other Consultants:  None    IAnti-Infectives:   Zosyn 08/04/2014 -->   Manson PasseyEVINE, [place], MD  [facility] Pager [phone]  If 7PM-7AM, please contact night-coverage [email or web address] Password [email or web address] 08/06/2014, 4:50 PM   LOS: 3 days    HPI/Subjective: No acute overnight events.  Objective: Filed Vitals:   08/05/14 2133 08/06/14 0530 08/06/14 1505 08/06/14 1546  BP: 154/80 108/69 125/78 136/81  Pulse: 59 46 65 69  Temp: 98 F (36.7 C) 98.7 F (37.1 C) 99.3 F (37.4 C) 98.1 F (36.7 C)  TempSrc: Oral Oral Oral Oral  Resp: 20 20 18 20   Height:      Weight:      SpO2: 98% 97% 96% 100%    Intake/Output Summary (Last 24 hours) at 08/06/14 1650 Last data filed at 08/06/14 1342  Gross per 24 hour  Intake   1770 ml  Output      0 ml  Net   1770 ml    Exam:   General:  Pt is alert, follows commands appropriately, not in acute distress  Cardiovascular: Regular rate and rhythm, S1/S2 (+)  Respiratory: Clear to auscultation bilaterally, no wheezing, no crackles, no rhonchi  Abdomen: Soft, non tender, non distended, bowel sounds present  Extremities: No edema, pulses DP and PT palpable bilaterally  Neuro: Grossly nonfocal  Data Reviewed: Basic Metabolic Panel:  Recent Labs Lab 08/03/14 1044 08/04/14 0516 08/06/14 0435  NA 135 137 139  K 3.8 4.0 4.3  CL 100 105 106  CO2 27 28 23   GLUCOSE 137* 86 86  BUN 16 15 24*  CREATININE 1.29 1.16 1.33  CALCIUM 9.2  8.6 8.5   Liver Function Tests:  Recent Labs Lab 08/03/14 1044  AST 27  ALT 22  ALKPHOS 51  BILITOT 2.0*  PROT 7.8  ALBUMIN 3.6    Recent Labs Lab 08/03/14 1044  LIPASE 27   No results for input(s): AMMONIA in the last 168 hours. CBC:  Recent Labs Lab 08/03/14 1044 08/04/14 0516 08/06/14 0435  WBC 13.0* 12.6* 10.5  NEUTROABS 9.9*  --   --   HGB 16.7 15.4 16.0  HCT 48.0 45.3 46.0  MCV 90.2 89.5 88.6  PLT 134* 131* 157   Cardiac Enzymes: No results for  input(s): CKTOTAL, CKMB, CKMBINDEX, TROPONINI in the last 168 hours. BNP: Invalid input(s): POCBNP CBG:  Recent Labs Lab 08/03/14 2234 08/04/14 0822 08/04/14 1415 08/04/14 1740 08/06/14 1358  GLUCAP 92 87 88 123* 77    No results found for this or any previous visit (from the past 240 hour(s)).   Scheduled Meds: . pantoprazole (PROTONIX) IV  40 mg Intravenous Q24H  . piperacillin-tazobactam (ZOSYN)  IV  3.375 g Intravenous Q8H   Continuous Infusions: . sodium chloride 1,000 mL (08/05/14 0947)

## 2014-08-06 NOTE — Progress Notes (Signed)
Patient asked if patients chest is still hurting? Patient denies chest discomfort and told patients family to translate that "the last medication (protonix) helped a lot"

## 2014-08-07 DIAGNOSIS — K229 Disease of esophagus, unspecified: Secondary | ICD-10-CM

## 2014-08-07 LAB — BASIC METABOLIC PANEL
Anion gap: 8 (ref 5–15)
BUN: 26 mg/dL — AB (ref 6–23)
CO2: 27 mmol/L (ref 19–32)
Calcium: 8.9 mg/dL (ref 8.4–10.5)
Chloride: 106 mmol/L (ref 96–112)
Creatinine, Ser: 1.27 mg/dL (ref 0.50–1.35)
GFR calc non Af Amer: 61 mL/min — ABNORMAL LOW (ref >90)
GFR, EST AFRICAN AMERICAN: 70 mL/min — AB (ref >90)
Glucose, Bld: 79 mg/dL (ref 70–99)
POTASSIUM: 4 mmol/L (ref 3.5–5.1)
Sodium: 141 mmol/L (ref 135–145)

## 2014-08-07 LAB — CBC
HCT: 44.8 % (ref 39.0–52.0)
Hemoglobin: 15.5 g/dL (ref 13.0–17.0)
MCH: 30.6 pg (ref 26.0–34.0)
MCHC: 34.6 g/dL (ref 30.0–36.0)
MCV: 88.5 fL (ref 78.0–100.0)
PLATELETS: 170 10*3/uL (ref 150–400)
RBC: 5.06 MIL/uL (ref 4.22–5.81)
RDW: 13.6 % (ref 11.5–15.5)
WBC: 8.8 10*3/uL (ref 4.0–10.5)

## 2014-08-07 LAB — GLUCOSE, CAPILLARY
GLUCOSE-CAPILLARY: 83 mg/dL (ref 70–99)
Glucose-Capillary: 112 mg/dL — ABNORMAL HIGH (ref 70–99)
Glucose-Capillary: 97 mg/dL (ref 70–99)

## 2014-08-07 LAB — HEPATIC FUNCTION PANEL
ALT: 19 U/L (ref 0–53)
AST: 27 U/L (ref 0–37)
Albumin: 3.1 g/dL — ABNORMAL LOW (ref 3.5–5.2)
Alkaline Phosphatase: 40 U/L (ref 39–117)
BILIRUBIN DIRECT: 0.3 mg/dL (ref 0.0–0.5)
BILIRUBIN INDIRECT: 1.6 mg/dL — AB (ref 0.3–0.9)
BILIRUBIN TOTAL: 1.9 mg/dL — AB (ref 0.3–1.2)
Total Protein: 7.2 g/dL (ref 6.0–8.3)

## 2014-08-07 LAB — PHOSPHORUS: PHOSPHORUS: 3.4 mg/dL (ref 2.3–4.6)

## 2014-08-07 LAB — MAGNESIUM: MAGNESIUM: 2.1 mg/dL (ref 1.5–2.5)

## 2014-08-07 MED ORDER — FAT EMULSION 20 % IV EMUL
240.0000 mL | INTRAVENOUS | Status: AC
  Administered 2014-08-07: 240 mL via INTRAVENOUS
  Filled 2014-08-07: qty 250

## 2014-08-07 MED ORDER — INSULIN ASPART 100 UNIT/ML ~~LOC~~ SOLN
0.0000 [IU] | Freq: Four times a day (QID) | SUBCUTANEOUS | Status: DC
  Administered 2014-08-08 – 2014-08-09 (×2): 1 [IU] via SUBCUTANEOUS
  Administered 2014-08-09: 2 [IU] via SUBCUTANEOUS

## 2014-08-07 MED ORDER — SODIUM CHLORIDE 0.9 % IV SOLN: INTRAVENOUS | Status: DC

## 2014-08-07 MED ORDER — TRACE MINERALS CR-CU-F-FE-I-MN-MO-SE-ZN IV SOLN
INTRAVENOUS | Status: AC
  Administered 2014-08-07: 17:00:00 via INTRAVENOUS
  Filled 2014-08-07: qty 960

## 2014-08-07 NOTE — Progress Notes (Signed)
Subjective: Since I last evaluated the patient, he seems to be doig much better. He denies having any abdominal pain or chest pain. Anxious to eat.  Objective: Vital signs in last 24 hours: Temp:  [97.8 F (36.6 C)-98.3 F (36.8 C)] 98.3 F (36.8 C) (03/29 1530) Pulse Rate:  [62-64] 62 (03/29 1530) Resp:  [16-18] 18 (03/29 0541) BP: (137-151)/(80-89) 150/83 mmHg (03/29 1530) SpO2:  [98 %-100 %] 98 % (03/29 1530) Last BM Date: 08/06/14  Intake/Output from previous day: 03/28 0701 - 03/29 0700 In: 887.5 [I.V.:887.5] Out: -  Intake/Output this shift:    General appearance: alert, cooperative, appears stated age and no distress Resp: clear to auscultation bilaterally Cardio: regular rate and rhythm, S1, S2 normal, no murmur, click, rub or gallop GI: soft, non-tender; bowel sounds normal; no masses,  no organomegaly Extremities: extremities normal, atraumatic, no cyanosis or edema  Lab Results:  Recent Labs  08/06/14 0435 08/07/14 0630  WBC 10.5 8.8  HGB 16.0 15.5  HCT 46.0 44.8  PLT 157 170   BMET  Recent Labs  08/06/14 0435 08/07/14 0630  NA 139 141  K 4.3 4.0  CL 106 106  CO2 23 27  GLUCOSE 86 79  BUN 24* 26*  CREATININE 1.33 1.27  CALCIUM 8.5 8.9   LFT  Recent Labs  08/07/14 0630  PROT 7.2  ALBUMIN 3.1*  AST 27  ALT 19  ALKPHOS 40  BILITOT 1.9*  BILIDIR 0.3  IBILI 1.6*   Medications: I have reviewed the patient's current medications.  Assessment/Plan: *Contained esophageal perforation after removal of a foreign body-will get a repeat gastrograffin swallow on 08/09/14. Continue present care.  LOS: 4 days   Tama Grosz 08/07/2014, 7:57 PM

## 2014-08-07 NOTE — Progress Notes (Signed)
Used telephone interpretor [phone] to explain plan of care and TPN. Reeducated on importance of staying NPO. Patient verbalizes understanding.

## 2014-08-07 NOTE — Progress Notes (Signed)
PARENTERAL NUTRITION CONSULT NOTE - FOLLOW UP  Pharmacy Consult for TPN Indication: esophageal perforation  No Known Allergies  Patient Measurements: Height: 5\' 6"  (167.6 cm) Weight: 143 lb 4.8 oz (65 kg) IBW/kg (Calculated) : 63.8   Vital Signs: Temp: 98.2 F (36.8 C) (03/29 0541) Temp Source: Oral (03/29 0541) BP: 137/80 mmHg (03/29 0541) Intake/Output from previous day: 03/28 0701 - 03/29 0700 In: 887.5 [I.V.:887.5] Out: -  Intake/Output from this shift:    Labs:  Recent Labs  08/06/14 0435 08/07/14 0630  WBC 10.5 8.8  HGB 16.0 15.5  HCT 46.0 44.8  PLT 157 170     Recent Labs  08/06/14 0435 08/07/14 0630  NA 139 141  K 4.3 4.0  CL 106 106  CO2 23 27  GLUCOSE 86 79  BUN 24* 26*  CREATININE 1.33 1.27  CALCIUM 8.5 8.9  PROT  --  7.2  ALBUMIN  --  3.1*  AST  --  27  ALT  --  19  ALKPHOS  --  40  BILITOT  --  1.9*  BILIDIR  --  0.3  IBILI  --  1.6*   Estimated Creatinine Clearance: 57.2 mL/min (by C-G formula based on Cr of 1.27).    Recent Labs  08/06/14 1358 08/06/14 2112 08/07/14 0813  GLUCAP 77 72 83    Medications:  Infusions:  . sodium chloride 1,000 mL (08/05/14 0947)    Insulin Requirements in the past 24 hours:  none  Current Nutrition:  NPO  Assessment:  Admit: intermittent dysphagia with removal of foreign body and esophageal tear on 3/26  GI: esophageal leak due to foreign body (removed 3/26), unable to place NG tube and teams are opting for parenteral nutrition. Repeat gastrograffin study pending for 3/31. Endo: no history of DM, glucose 70 Lytes: K 4, Mag, Phos Renal: SCr 1.3 today - appears stable; NS at 75 ml/hr Pulm: RA Cards: VSS Hepatobil: TBili 1.9, LFTs wnl Neuro: A&O ID: On Zosyn Day #4 empirically Best Practices: SCDs, PPI IV TPN Access: PICC placed 3/27 TPN day#: 1   Nutritional Goals:  1750-1950 kCal, 80-90 grams of protein per day  Plan:  Start Clinimix E 5/15 at 40 ml/hr + 20% lipids at 10  ml/hr.  This will provide 1162 kCal and 48 grams of protein.  Anticipate his goal rate will be ~75 ml/hr. Daily multivitamin and trace elements Decrease IV fluids to 35 ml/hr when TPN starts Check CBGs q6h and provide sensitive SSI for coverage Check TPN labs in AM  Estella HuskMichelle Rose-Marie Hickling, VermontPharm.D., BCPS, AAHIVP Clinical Pharmacist Phone: [phone] or [phone] 08/07/2014, 11:19 AM

## 2014-08-07 NOTE — Progress Notes (Signed)
Patient ID: [name] Daugherty, male   DOB: 04/02/1956, 59 y.o.   MRN: [id number] [facility] PROGRESS NOTE  [name] Daugherty:[id number] DOB: 10/20/1955 DOA: 08/03/2014 PCP: No PCP Per Patient     Brief narrative:    59 year old male with no significant past medical history who [facility]. GI has seen the patient in consultation and performed EGD 08/03/2014 but could not have dislodged the foreign object and recommended surgical evaluation.    HPI/Subjective:  Walking in hallway, appears comfortable, we utilized a [place] physician for interpretation, patient denies any headache, no chest abdominal pain. No shortness of breath or weakness.   Assessment/Plan:    Active Problems: Dysphagia secondary to foreign object / esophageal perforation / Leukocytosis - CT abdomen showed distal esophageal wall thickening, ? mass/ neoplasm/ ? foreign object rather than a mass - EGD unable to have object dislodged and removed - Status post removal of the foreign object by Dr. Dorris FetchHendrickson ([facility]) with subsequent esophageal perforation  - Started zosyn 08/04/2014 - Repeat gastrograffin swallow study Thursday 3/31, NPO till then. -IV PPI continue     DVT Prophylaxis  - SCD's bilaterally      Code Status: Full.  Family Communication: plan of care discussed with the patient and his family at the bedside  Disposition Plan: not ready for discharge, on IV zosyn, still NPO   IV access:  Peripheral IV  Procedures and diagnostic studies:     Ct Abdomen Pelvis W Contrast 08/03/2014 Distal esophageal wall thickening. Cannot exclude esophageal mass/ neoplasm. Recommend further evaluation with endoscopy. Bibasilar atelectasis. 08/03/2014    Distal esophageal wall thickening. Cannot exclude esophageal mass/ neoplasm. Recommend further evaluation with endoscopy.  Bibasilar atelectasis.  Electronically Signed: By: Charlett NoseKevin  Dover M.D. On: 08/03/2014 18:05   Dg Esophagus  08/04/2014   Focal distal esophageal perforation. These results were called by telephone at the time of interpretation on 08/04/2014 at 3:28 pm to Dr. Charna ElizabethJYOTHI MANN , who verbally acknowledged these results.      Medical Consultants:  Gastroenterology - Dr. Loreta AveMann  CTS - Dr. Bobbye CharlestonHendrickson    Kenon Delashmit K, MD  [facility] Pager [phone]  If 7PM-7AM, please contact night-coverage [email or web address] Password [email or web address] 08/07/2014, 8:40 AM   LOS: 4 days      Objective: Filed Vitals:   08/06/14 1505 08/06/14 1546 08/06/14 2114 08/07/14 0541  BP: 125/78 136/81 151/89 137/80  Pulse: 65 69 64   Temp: 99.3 F (37.4 C) 98.1 F (36.7 C) 97.8 F (36.6 C) 98.2 F (36.8 C)  TempSrc: Oral Oral Oral Oral  Resp: 18 20 16 18   Height:      Weight:      SpO2: 96% 100% 100% 99%    Intake/Output Summary (Last 24 hours) at 08/07/14 0840 Last data filed at 08/06/14 1842  Gross per 24 hour  Intake  812.5 ml  Output      0 ml  Net  812.5 ml    Exam:   General:  Pt is alert, follows commands appropriately, not in acute distress  Cardiovascular: Regular rate and rhythm, S1/S2 (+)  Respiratory: Clear to auscultation bilaterally, no wheezing, no crackles, no rhonchi  Abdomen: Soft, non tender, non distended, bowel sounds present  Extremities: No edema, pulses DP and PT palpable bilaterally  Neuro: Grossly nonfocal  Data Reviewed: Basic Metabolic Panel:  Recent Labs Lab 08/03/14 1044 08/04/14 0516 08/06/14 0435 08/07/14 0630  NA 135 137 139 141  K 3.8 4.0  4.3 4.0  CL 100 105 106 106  CO2 GLUCOSE 137* 86 86 79  BUN 16 15 24* 26*  CREATININE 1.29 1.16 1.33 1.27  CALCIUM 9.2 8.6 8.5 8.9   Liver Function Tests:  Recent Labs Lab 08/03/14 1044 08/07/14 0630  AST 27 27  ALT 22 19  ALKPHOS 51 PENDING  BILITOT 2.0* 1.9*  PROT 7.8 7.2  ALBUMIN 3.6 3.1*    Recent Labs Lab 08/03/14 1044  LIPASE 27   No results for input(s): AMMONIA in the last 168  hours. CBC:  Recent Labs Lab 08/03/14 1044 08/04/14 0516 08/06/14 0435 08/07/14 0630  WBC 13.0* 12.6* 10.5 8.8  NEUTROABS 9.9*  --   --   --   HGB 16.7 15.4 16.0 15.5  HCT 48.0 45.3 46.0 44.8  MCV 90.2 89.5 88.6 88.5  PLT 134* 131* 157 170   Cardiac Enzymes: No results for input(s): CKTOTAL, CKMB, CKMBINDEX, TROPONINI in the last 168 hours. BNP: Invalid input(s): POCBNP CBG:  Recent Labs Lab 08/04/14 1415 08/04/14 1740 08/06/14 1358 08/06/14 2112 08/07/14 0813  GLUCAP 88 123* 77 72 83    No results found for this or any previous visit (from the past 240 hour(s)).   Scheduled Meds: . pantoprazole (PROTONIX) IV  40 mg Intravenous Q24H  . piperacillin-tazobactam (ZOSYN)  IV  3.375 g Intravenous Q8H   Continuous Infusions: . sodium chloride 1,000 mL (08/05/14 0947)

## 2014-08-07 NOTE — Progress Notes (Signed)
      [address]       [place] [id number]             [phone] 267 2014      Feels well today Denies pain and nausea Lungs clear, abdomen benign WBC normal  Contained esophageal perforation  Repeat swallow on Thursday

## 2014-08-08 LAB — GLUCOSE, CAPILLARY
GLUCOSE-CAPILLARY: 127 mg/dL — AB (ref 70–99)
Glucose-Capillary: 133 mg/dL — ABNORMAL HIGH (ref 70–99)
Glucose-Capillary: 118 mg/dL — ABNORMAL HIGH (ref 70–99)

## 2014-08-08 LAB — COMPREHENSIVE METABOLIC PANEL
ALT: 19 U/L (ref 0–53)
AST: 33 U/L (ref 0–37)
Albumin: 3.1 g/dL — ABNORMAL LOW (ref 3.5–5.2)
Alkaline Phosphatase: 39 U/L (ref 39–117)
Anion gap: 3 — ABNORMAL LOW (ref 5–15)
BILIRUBIN TOTAL: 1.4 mg/dL — AB (ref 0.3–1.2)
BUN: 17 mg/dL (ref 6–23)
CHLORIDE: 107 mmol/L (ref 96–112)
CO2: 29 mmol/L (ref 19–32)
Calcium: 8.8 mg/dL (ref 8.4–10.5)
Creatinine, Ser: 1.07 mg/dL (ref 0.50–1.35)
GFR calc Af Amer: 87 mL/min — ABNORMAL LOW (ref >90)
GFR calc non Af Amer: 75 mL/min — ABNORMAL LOW (ref >90)
Glucose, Bld: 119 mg/dL — ABNORMAL HIGH (ref 70–99)
Potassium: 3.9 mmol/L (ref 3.5–5.1)
Sodium: 139 mmol/L (ref 135–145)
TOTAL PROTEIN: 7.3 g/dL (ref 6.0–8.3)

## 2014-08-08 LAB — CLOSTRIDIUM DIFFICILE BY PCR: CDIFFPCR: NEGATIVE

## 2014-08-08 LAB — MAGNESIUM: Magnesium: 2 mg/dL (ref 1.5–2.5)

## 2014-08-08 LAB — TRIGLYCERIDES: Triglycerides: 148 mg/dL (ref <150)

## 2014-08-08 LAB — PHOSPHORUS: Phosphorus: 3.1 mg/dL (ref 2.3–4.6)

## 2014-08-08 MED ORDER — SODIUM CHLORIDE 0.9 % IV SOLN
INTRAVENOUS | Status: DC
  Administered 2014-08-08: 500 mL via INTRAVENOUS

## 2014-08-08 MED ORDER — FAT EMULSION 20 % IV EMUL
240.0000 mL | INTRAVENOUS | Status: AC
  Administered 2014-08-08: 240 mL via INTRAVENOUS
  Filled 2014-08-08: qty 250

## 2014-08-08 MED ORDER — CLINIMIX E/DEXTROSE (5/15) 5 % IV SOLN
INTRAVENOUS | Status: AC
  Administered 2014-08-08: 18:00:00 via INTRAVENOUS
  Filled 2014-08-08: qty 1800

## 2014-08-08 NOTE — Progress Notes (Signed)
Subjective: Since I last evaluated the patient, he has been doing fairly well. Denies having any chest pain, nausea or breakthrough reflux.   Objective: Vital signs in last 24 hours: Temp:  [97.4 F (36.3 C)-98.5 F (36.9 C)] 97.4 F (36.3 C) (03/30 1416) Pulse Rate:  [56-65] 65 (03/30 1416) Resp:  [13-18] 18 (03/30 1416) BP: (116-162)/(83-92) 116/83 mmHg (03/30 1416) SpO2:  [99 %-100 %] 99 % (03/30 1416) Last BM Date: 08/08/14  Intake/Output from previous day: 03/29 0701 - 03/30 0700 In: 2125.1 [I.V.:1404.9; IV Piggyback:50; TPN:670.2] Out: -  Intake/Output this shift: Total I/O In: 465 [IV Piggyback:50; TPN:415] Out: -   General appearance: alert, cooperative, appears stated age and no distress Resp: clear to auscultation bilaterally Cardio: regular rate and rhythm, S1, S2 normal, no murmur, click, rub or gallop GI: soft, non-tender; bowel sounds normal; no masses,  no organomegaly Extremities: extremities normal, atraumatic, no cyanosis or edema  Lab Results:  Recent Labs  08/06/14 0435 08/07/14 0630  WBC 10.5 8.8  HGB 16.0 15.5  HCT 46.0 44.8  PLT 157 170   BMET  Recent Labs  08/06/14 0435 08/07/14 0630 08/08/14 1030  NA 139 141 139  K 4.3 4.0 3.9  CL 106 106 107  CO2 23 27 29   GLUCOSE 86 79 119*  BUN 24* 26* 17  CREATININE 1.33 1.27 1.07  CALCIUM 8.5 8.9 8.8   LFT  Recent Labs  08/07/14 0630 08/08/14 1030  PROT 7.2 7.3  ALBUMIN 3.1* 3.1*  AST 27 33  ALT 19 19  ALKPHOS 40 39  BILITOT 1.9* 1.4*  BILIDIR 0.3  --   IBILI 1.6*  --    Medications: I have reviewed the patient's current medications.  Assessment/Plan: 1) Focal esophageal perforation after removal of foreign body-currently stable. Will get another gastrograffin swallow tomorrow to re-evaluate.  2) GERD/Distal esophagitis/Feeding difficulties on TPN.   LOS: 5 days   [name] Daugherty 08/08/2014, 6:41 PM

## 2014-08-08 NOTE — Progress Notes (Signed)
4 Days Post-Op Procedure(s) (LRB): FOREIGN BODY REMOVAL ( ESOPHAGEAL MASS ) (N/A) Subjective: No complaints, except hungry Denies pain  Objective: Vital signs in last 24 hours: Temp:  [98.2 F (36.8 C)-98.5 F (36.9 C)] 98.5 F (36.9 C) (03/30 0550) Pulse Rate:  [56-62] 62 (03/30 0550) Cardiac Rhythm:  [-]  Resp:  [13-15] 13 (03/30 0550) BP: (145-162)/(83-92) 145/92 mmHg (03/30 0550) SpO2:  [98 %-100 %] 100 % (03/30 0550)  Hemodynamic parameters for last 24 hours:    Intake/Output from previous day: 03/29 0701 - 03/30 0700 In: 2125.1 [I.V.:1404.9; IV Piggyback:50; TPN:670.2] Out: -  Intake/Output this shift:    General appearance: alert, cooperative and no distress Abdomen: normal findings: soft, non-tender  Lab Results:  Recent Labs  08/06/14 0435 08/07/14 0630  WBC 10.5 8.8  HGB 16.0 15.5  HCT 46.0 44.8  PLT 157 170   BMET:  Recent Labs  08/06/14 0435 08/07/14 0630  NA 139 141  K 4.3 4.0  CL 106 106  CO2 23 27  GLUCOSE 86 79  BUN 24* 26*  CREATININE 1.33 1.27  CALCIUM 8.5 8.9    PT/INR: No results for input(s): LABPROT, INR in the last 72 hours. ABG No results found for: PHART, HCO3, TCO2, ACIDBASEDEF, O2SAT CBG (last 3)   Recent Labs  08/07/14 1825 08/07/14 2346 08/08/14 0554  GLUCAP 97 112* 127*    Assessment/Plan: S/P Procedure(s) (LRB): FOREIGN BODY REMOVAL ( ESOPHAGEAL MASS ) (N/A) -  Contained esophageal perforation Being managed conservatively- has improved clinically Afebrile on zosyn- continue minimum of 7 days For repeat swallow tomorrow to reassess esophagus   LOS: 5 days    Loreli SlotSteven C Aliany Fiorenza 08/08/2014

## 2014-08-08 NOTE — Progress Notes (Signed)
Patient ID: [name] Daugherty, male   DOB: 04/16/1956, 59 y.o.   MRN: [id number] [facility] PROGRESS NOTE  [name] Daugherty [id number] DOB: 03/29/1956 DOA: 08/03/2014 PCP: No PCP Per Patient     Brief narrative:    59 year old male with no significant past medical history who [facility] with intermittent dysphagia. GI has seen the patient in consultation and performed EGD 08/03/2014 but could not have dislodged the foreign object and recommended surgical evaluation.    HPI/Subjective:  Walking in hallway, appears comfortable, we utilized a [place] physician for interpretation, patient denies any headache, no chest abdominal pain. No shortness of breath or weakness.   Assessment/Plan:    Active Problems: Dysphagia secondary to foreign object / esophageal perforation / Leukocytosis - CT abdomen showed distal esophageal wall thickening, ? mass/ neoplasm/ ? foreign object rather than a mass - EGD unable to have object dislodged and removed - Status post removal of the foreign object by Dr. Dorris FetchHendrickson (CTVS) with subsequent esophageal perforation  - Started zosyn 08/04/2014 - Repeat gastrograffin swallow study Thursday 3/31, NPO till then. -IV PPI & TNA continue     DVT Prophylaxis  - SCD's bilaterally      Code Status: Full.  Family Communication: plan of care discussed with the patient and his family at the bedside  Disposition Plan: not ready for discharge, on IV zosyn, still NPO   IV access:  Peripheral IV  Procedures and diagnostic studies:     Ct Abdomen Pelvis W Contrast 08/03/2014 Distal esophageal wall thickening. Cannot exclude esophageal mass/ neoplasm. Recommend further evaluation with endoscopy. Bibasilar atelectasis. 08/03/2014    Distal esophageal wall thickening. Cannot exclude esophageal mass/ neoplasm. Recommend further evaluation with endoscopy.  Bibasilar atelectasis.  Electronically Signed: By: Charlett NoseKevin  Dover M.D. On: 08/03/2014 18:05   Dg  Esophagus 08/04/2014   Focal distal esophageal perforation. These results were called by telephone at the time of interpretation on 08/04/2014 at 3:28 pm to Dr. Charna ElizabethJYOTHI MANN , who verbally acknowledged these results.      Medical Consultants:  Gastroenterology - Dr. Loreta AveMann  CTS - Dr. Bobbye CharlestonHendrickson    SINGH,PRASHANT K, MD  [facility] Pager [phone]  If 7PM-7AM, please contact night-coverage [email or web address] Password [email or web address] 08/08/2014, 9:01 AM   LOS: 5 days      Objective: Filed Vitals:   08/07/14 0541 08/07/14 1530 08/07/14 2233 08/08/14 0550  BP: 137/80 150/83 162/88 145/92  Pulse:  62 56 62  Temp: 98.2 F (36.8 C) 98.3 F (36.8 C) 98.2 F (36.8 C) 98.5 F (36.9 C)  TempSrc: Oral Oral Oral Oral  Resp: 18  15 13   Height:      Weight:      SpO2: 99% 98% 99% 100%    Intake/Output Summary (Last 24 hours) at 08/08/14 0901 Last data filed at 08/08/14 [id number]  Gross per 24 hour  Intake 2050.09 ml  Output      0 ml  Net 2050.09 ml    Exam:   General:  Pt is alert, follows commands appropriately, not in acute distress  Cardiovascular: Regular rate and rhythm, S1/S2 (+)  Respiratory: Clear to auscultation bilaterally, no wheezing, no crackles, no rhonchi  Abdomen: Soft, non tender, non distended, bowel sounds present  Extremities: No edema, pulses DP and PT palpable bilaterally  Neuro: Grossly nonfocal  Data Reviewed: Basic Metabolic Panel:  Recent Labs Lab 08/03/14 1044 08/04/14 0516 08/06/14 0435 08/07/14 0630  NA 135 137 139 141  K 3.8 4.0  4.3 4.0  CL 100 105 106 106  CO2 GLUCOSE 137* 86 86 79  BUN 16 15 24* 26*  CREATININE 1.29 1.16 1.33 1.27  CALCIUM 9.2 8.6 8.5 8.9  MG  --   --   --  2.1  PHOS  --   --   --  3.4   Liver Function Tests:  Recent Labs Lab 08/03/14 1044 08/07/14 0630  AST 27 27  ALT 22 19  ALKPHOS 51 40  BILITOT 2.0* 1.9*  PROT 7.8 7.2  ALBUMIN 3.6 3.1*    Recent Labs Lab 08/03/14 1044  LIPASE 27   No  results for input(s): AMMONIA in the last 168 hours. CBC:  Recent Labs Lab 08/03/14 1044 08/04/14 0516 08/06/14 0435 08/07/14 0630  WBC 13.0* 12.6* 10.5 8.8  NEUTROABS 9.9*  --   --   --   HGB 16.7 15.4 16.0 15.5  HCT 48.0 45.3 46.0 44.8  MCV 90.2 89.5 88.6 88.5  PLT 134* 131* 157 170   Cardiac Enzymes: No results for input(s): CKTOTAL, CKMB, CKMBINDEX, TROPONINI in the last 168 hours. BNP: Invalid input(s): POCBNP CBG:  Recent Labs Lab 08/06/14 2112 08/07/14 0813 08/07/14 1825 08/07/14 2346 08/08/14 0554  GLUCAP 72 83 97 112* 127*    No results found for this or any previous visit (from the past 240 hour(s)).   Scheduled Meds: . insulin aspart  0-9 Units Subcutaneous 4 times per day  . pantoprazole (PROTONIX) IV  40 mg Intravenous Q24H  . piperacillin-tazobactam (ZOSYN)  IV  3.375 g Intravenous Q8H   Continuous Infusions: . sodium chloride 35 mL/hr at 08/07/14 1825  . Marland KitchenTPN (CLINIMIX-E) Adult 40 mL/hr at 08/07/14 1718   And  . fat emulsion 240 mL (08/07/14 1717)

## 2014-08-08 NOTE — Progress Notes (Signed)
PARENTERAL NUTRITION CONSULT NOTE - FOLLOW UP  Pharmacy Consult for TPN Indication: esophageal perforation  No Known Allergies  Patient Measurements: Height: 5\' 6"  (167.6 cm) Weight: 143 lb 4.8 oz (65 kg) IBW/kg (Calculated) : 63.8   Vital Signs: Temp: 98.5 F (36.9 C) (03/30 0550) Temp Source: Oral (03/30 0550) BP: 145/92 mmHg (03/30 0550) Pulse Rate: 62 (03/30 0550) Intake/Output from previous day: 03/29 0701 - 03/30 0700 In: 2125.1 [I.V.:1404.9; IV Piggyback:50; TPN:670.2] Out: -  Intake/Output from this shift:    Labs:  Recent Labs  08/06/14 0435 08/07/14 0630  WBC 10.5 8.8  HGB 16.0 15.5  HCT 46.0 44.8  PLT 157 170     Recent Labs  08/06/14 0435 08/07/14 0630 08/08/14 1030  NA 139 141 139  K 4.3 4.0 3.9  CL 106 106 107  CO2 23 27 29   GLUCOSE 86 79 119*  BUN 24* 26* 17  CREATININE 1.33 1.27 1.07  CALCIUM 8.5 8.9 8.8  MG  --  2.1 2.0  PHOS  --  3.4 3.1  PROT  --  7.2 7.3  ALBUMIN  --  3.1* 3.1*  AST  --  27 33  ALT  --  19 19  ALKPHOS  --  40 39  BILITOT  --  1.9* 1.4*  BILIDIR  --  0.3  --   IBILI  --  1.6*  --    Estimated Creatinine Clearance: 67.9 mL/min (by C-G formula based on Cr of 1.07).    Recent Labs  08/07/14 1825 08/07/14 2346 08/08/14 0554  GLUCAP 97 112* 127*    Medications:  Infusions:  . sodium chloride 35 mL/hr at 08/07/14 1825  . Marland Kitchen.TPN (CLINIMIX-E) Adult 40 mL/hr at 08/07/14 1718   And  . fat emulsion 240 mL (08/07/14 1717)    Insulin Requirements in the past 24 hours:  1 unit SSI  Current Nutrition:  Clinimix E at 40 ml/hr + IVFE at 10 ml/hr; NPO Assessment: 59 YOM presented on 08/03/14 with swallowing difficulty and epigastric pain for 3 days PTA. Endoscopy revealed a friable area with bleeding and foreign body with possible obstructing mass. Repeat EGD showed foreign body impacted in the distal esophagus and it was removed on 08/04/14. He also has a mucosal tear that was noted after surgery. Swallow study  showed distal esophageal leak with plan to repeat gastrograffin study on 08/09/14. Granddaughter translated for patient and stated he was eating regularly PTA.  TPN started 3/29 via PICC. TPN initiation well tolerated.   Admit: intermittent dysphagia with removal of foreign body and esophageal tear on 3/26  GI: esophageal leak due to foreign body (removed 3/26), unable to place NG tube and teams are opting for parenteral nutrition. Repeat gastrograffin study pending for 3/31. Endo: no history of DM, all CBGs < 150 on TPN  Lytes: all lytes WNL on day #1 of TPN Renal: SCr 1.07 - appears stable; NS at 35 ml/hr Pulm: RA Cards: VSS Hepatobil: TBili 1.9>1.4FTs wnl Neuro: A&O ID: On Zosyn Day #5 empirically Best Practices: SCDs, PPI IV TPN Access: PICC placed 3/27 TPN day#: 2  Nutritional Goals:  1750-1950 kCal, 80-90 grams of protein per day  Plan:  Increase Clinimix E 5/15 to goal rate of 75 ml/hr + 20% lipids at 10 ml/hr.  This will provide 1758 kCal and 90 grams of protein.  This meets 100% of needs. Daily multivitamin and trace elements Decrease IV fluids to KVO when TPN increased to goal rate Check CBGs  q6h and provide sensitive SSI for coverage - will DC once he tolerated TPN at goal rate TPN labs in AM  Herby Abraham, Pharm.D. [phone] 08/08/2014 12:09 PM

## 2014-08-08 NOTE — Progress Notes (Signed)
NUTRITION FOLLOW UP  Intervention:   -TPN management per pharmacy -RD will continue to monitor  Nutrition Dx:   Inadequate oral intake related to inability to eat as evidenced by NPO; ongoing  Goal:   Pt will meet >90% of estimated nutritional needs; progressing  Monitor:   TPN tolerance/adequacy, diet advancement, PO intake, labs, weight changes, I/O's  Assessment:   [name] Daugherty is a 59 y.o. male of [place] descent with no significant PMH presents to the ER with intermittent dysphagia for the last 2years, this worsened in the last week and now noted to have dysphagia with solids and liquids.  Pt admitted with dysphagia secondary to foreign object. S/p Procedure(s) (LRB) on 08/04/14: FOREIGN BODY REMOVAL ( ESOPHAGEAL MASS ) (N/A)  Per GI, pt is not a candidate for enteral nutrition due to focal esophageal perforation. TPN initiated on 08/07/14. Currently receiving Clinimix E 5/15 at 40 ml/hr + 20% lipids at 10 ml/hr, which provides 1162 kCal and 48 grams of protein (which meets 66% of minimum estimated nutritional needs and 60% of minimum estimated protein needs).Plan to increase Clinimix 5/15 to goal rate of 75 ml/hr at 1800, which will provide 1758 kCal and 90 grams of protein (100% of needs).   Pt for repeat gastrograffin study on 08/09/14.  Labs reviewed. Glucose: 119. CBGS: 112-133. Mg, K, and Phos WDL.   Height: Ht Readings from Last 1 Encounters:  08/04/14 5\' 6"  (1.676 m)    Weight Status:   Wt Readings from Last 1 Encounters:  08/04/14 143 lb 4.8 oz (65 kg)    Re-estimated needs:  Kcal: 1750-1950 Protein: 80-90 grams Fluid: 1.8-2.0 L  Skin: WDL  Diet Order: Diet NPO time specified TPN (CLINIMIX-E) Adult TPN (CLINIMIX-E) Adult   Intake/Output Summary (Last 24 hours) at 08/08/14 1306 Last data filed at 08/08/14 [id number]  Gross per 24 hour  Intake 2050.09 ml  Output      0 ml  Net 2050.09 ml    Last BM: 08/08/14   Labs:   Recent Labs Lab 08/06/14 0435  08/07/14 0630 08/08/14 1030  NA 139 141 139  K 4.3 4.0 3.9  CL 106 106 107  CO2 23 27 29   BUN 24* 26* 17  CREATININE 1.33 1.27 1.07  CALCIUM 8.5 8.9 8.8  MG  --  2.1 2.0  PHOS  --  3.4 3.1  GLUCOSE 86 79 119*    CBG (last 3)   Recent Labs  08/07/14 2346 08/08/14 0554 08/08/14 1153  GLUCAP 112* 127* 133*    Scheduled Meds: . insulin aspart  0-9 Units Subcutaneous 4 times per day  . pantoprazole (PROTONIX) IV  40 mg Intravenous Q24H  . piperacillin-tazobactam (ZOSYN)  IV  3.375 g Intravenous Q8H    Continuous Infusions: . sodium chloride    . Marland Kitchen.TPN (CLINIMIX-E) Adult 40 mL/hr at 08/07/14 1718   And  . fat emulsion 240 mL (08/07/14 1717)  . Marland Kitchen.TPN (CLINIMIX-E) Adult     And  . fat emulsion      Makaylin Carlo A. Mayford KnifeWilliams, RD, LDN, CDE Pager: [phone] After hours Pager: [phone]

## 2014-08-09 ENCOUNTER — Inpatient Hospital Stay (HOSPITAL_COMMUNITY): Payer: 59

## 2014-08-09 LAB — GLUCOSE, CAPILLARY
GLUCOSE-CAPILLARY: 130 mg/dL — AB (ref 70–99)
GLUCOSE-CAPILLARY: 155 mg/dL — AB (ref 70–99)

## 2014-08-09 LAB — MAGNESIUM: Magnesium: 2.2 mg/dL (ref 1.5–2.5)

## 2014-08-09 LAB — COMPREHENSIVE METABOLIC PANEL
ALK PHOS: 37 U/L — AB (ref 39–117)
ALT: 32 U/L (ref 0–53)
AST: 40 U/L — AB (ref 0–37)
Albumin: 3 g/dL — ABNORMAL LOW (ref 3.5–5.2)
Anion gap: 9 (ref 5–15)
BUN: 14 mg/dL (ref 6–23)
CO2: 25 mmol/L (ref 19–32)
Calcium: 8.8 mg/dL (ref 8.4–10.5)
Chloride: 104 mmol/L (ref 96–112)
Creatinine, Ser: 1.09 mg/dL (ref 0.50–1.35)
GFR calc non Af Amer: 73 mL/min — ABNORMAL LOW (ref >90)
GFR, EST AFRICAN AMERICAN: 85 mL/min — AB (ref >90)
GLUCOSE: 133 mg/dL — AB (ref 70–99)
POTASSIUM: 3.4 mmol/L — AB (ref 3.5–5.1)
SODIUM: 138 mmol/L (ref 135–145)
Total Bilirubin: 0.9 mg/dL (ref 0.3–1.2)
Total Protein: 7.2 g/dL (ref 6.0–8.3)

## 2014-08-09 LAB — PREALBUMIN: PREALBUMIN: 12 mg/dL (ref 18.0–45.0)

## 2014-08-09 LAB — PHOSPHORUS: Phosphorus: 3.8 mg/dL (ref 2.3–4.6)

## 2014-08-09 MED ORDER — FAT EMULSION 20 % IV EMUL
240.0000 mL | INTRAVENOUS | Status: DC
  Administered 2014-08-09: 240 mL via INTRAVENOUS
  Filled 2014-08-09: qty 250

## 2014-08-09 MED ORDER — POTASSIUM CHLORIDE 10 MEQ/100ML IV SOLN: 10.0000 meq | INTRAVENOUS | Status: DC

## 2014-08-09 MED ORDER — TRACE MINERALS CR-CU-F-FE-I-MN-MO-SE-ZN IV SOLN
INTRAVENOUS | Status: DC
  Administered 2014-08-09: 17:00:00 via INTRAVENOUS
  Filled 2014-08-09: qty 1800

## 2014-08-09 MED ORDER — IOHEXOL 300 MG/ML  SOLN
150.0000 mL | Freq: Once | INTRAMUSCULAR | Status: AC | PRN
  Administered 2014-08-09: 300 mL via ORAL

## 2014-08-09 MED ORDER — POTASSIUM CHLORIDE 10 MEQ/50ML IV SOLN
10.0000 meq | INTRAVENOUS | Status: AC
  Administered 2014-08-09 (×4): 10 meq via INTRAVENOUS
  Filled 2014-08-09 (×4): qty 50

## 2014-08-09 NOTE — Progress Notes (Signed)
PARENTERAL NUTRITION CONSULT NOTE - FOLLOW UP  Pharmacy Consult for TPN Indication: esophageal perforation  No Known Allergies  Patient Measurements: Height:  (167.6 cm) Weight: 143 lb 4.8 oz (65 kg) IBW/kg (Calculated) : 63.8   Vital Signs: Temp: 98.2 F (36.8 C) (03/31 0536) Temp Source: Oral (03/31 0536) BP: 136/90 mmHg (03/31 0536) Intake/Output from previous day: 03/30 0701 - 03/31 0700 In: 1927.3 [I.V.:145.3; IV Piggyback:150; TPN:1631.9] Out: -  Intake/Output from this shift: Total I/O In: 264.4 [I.V.:27.8; TPN:236.6] Out: -   Labs:  Recent Labs  08/07/14 0630  WBC 8.8  HGB 15.5  HCT 44.8  PLT 170     Recent Labs  08/07/14 0630 08/08/14 1030 08/09/14 0515  NA 141 139 138  K 4.0 3.9 3.4*  CL 106 107 104  CO2 GLUCOSE 79 119* 133*  BUN 26* 17 14  CREATININE 1.27 1.07 1.09  CALCIUM 8.9 8.8 8.8  MG 2.1 2.0 2.2  PHOS 3.4 3.1 3.8  PROT 7.2 7.3 7.2  ALBUMIN 3.1* 3.1* 3.0*  AST 27 33 40*  ALT 19 19 32  ALKPHOS 40 39 37*  BILITOT 1.9* 1.4* 0.9  BILIDIR 0.3  --   --   IBILI 1.6*  --   --   TRIG  --  148  --    Estimated Creatinine Clearance: 66.7 mL/min (by C-G formula based on Cr of 1.09).    Recent Labs  08/08/14 1652 08/09/14 0004 08/09/14 0555  GLUCAP 118* 155* 130*    Medications:  Infusions:  . sodium chloride 500 mL (08/08/14 1603)  . Marland KitchenTPN (CLINIMIX-E) Adult 75 mL/hr at 08/08/14 1802   And  . fat emulsion 240 mL (08/08/14 1802)    Insulin Requirements in the past 24 hours:  3 units SSI  Current Nutrition:  Clinimix E at 75 ml/hr + IVFE at 10 ml/hr; NPO Assessment: 59 YOM presented on 08/03/14 with swallowing difficulty and epigastric pain for 3 days PTA. Endoscopy revealed a friable area with bleeding and foreign body with possible obstructing mass. Repeat EGD showed foreign body impacted in the distal esophagus and it was removed on 08/04/14. He also has a mucosal tear that was noted after surgery. Swallow  study showed distal esophageal leak with plan to repeat gastrograffin study on 08/09/14. Granddaughter translated for patient and stated he was eating regularly PTA.  TPN started 3/29 via PICC. TPN initiation well tolerated.   Admit: intermittent dysphagia with removal of foreign body and esophageal tear on 3/26  GI: esophageal leak due to foreign body (removed 3/26), unable to place NG tube and teams are opting for parenteral nutrition. Repeat gastrograffin study 3/31 showed persistent contained esophageal perforation distal esophagus, slightly improved from prior study Endo: no history of DM, all CBGs < 150 on TPN at goal rate, will DC SSI and CBGs Lytes:  K 3.4 on day #2 TPN, all other lytes WNL Renal: SCr 1.09- appears stable; NS at 10l/hr Pulm: RA Cards: VSS Hepatobil: TBili 1.9>1.4>-0.9 WNL Neuro: A&O ID: On Zosyn Day #6empirically Best Practices: SCDs, PPI IV TPN Access: PICC placed 3/27 TPN day#: 3  Nutritional Goals:  1750-1950 kCal, 80-90 grams of protein per day  Plan:  Continue Clinimix E 5/15 at goal rate of 75 ml/hr + 20% lipids at 10 ml/hr.  This provides 1758 kCal and 90 grams of protein.  This meets 100% of needs. 4 runs of K Daily multivitamin and trace elements DC SSI and CGS BMET  in am  Herby AbrahamMichelle T. Hollye Pritt, Pharm.D. [phone] 08/09/2014 9:43 AM

## 2014-08-09 NOTE — Progress Notes (Signed)
5 Days Post-Op Procedure(s) (LRB): FOREIGN BODY REMOVAL ( ESOPHAGEAL MASS ) (N/A) Subjective: Patient feels well Asking for ice chips TNA has been started  Objective: Vital signs in last 24 hours: Temp:  [98.1 F (36.7 C)-98.3 F (36.8 C)] 98.3 F (36.8 C) (03/31 1255) Pulse Rate:  [61] 61 (03/31 1255) Cardiac Rhythm:  [-]  Resp:  [16-20] 18 (03/31 1255) BP: (120-151)/(89-95) 120/90 mmHg (03/31 1255) SpO2:  [99 %-100 %] 99 % (03/31 1255)  Hemodynamic parameters for last 24 hours:    Intake/Output from previous day: 03/30 0701 - 03/31 0700 In: 1927.3 [I.V.:145.3; IV Piggyback:150; TPN:1631.9] Out: -  Intake/Output this shift: Total I/O In: 1139.8 [I.V.:113; IV Piggyback:100; TPN:926.8] Out: -   well appearing  Lab Results:  Recent Labs  08/07/14 0630  WBC 8.8  HGB 15.5  HCT 44.8  PLT 170   BMET:  Recent Labs  08/08/14 1030 08/09/14 0515  NA 139 138  K 3.9 3.4*  CL 107 104  CO2 29 25  GLUCOSE 119* 133*  BUN 17 14  CREATININE 1.07 1.09  CALCIUM 8.8 8.8    PT/INR: No results for input(s): LABPROT, INR in the last 72 hours. ABG No results found for: PHART, HCO3, TCO2, ACIDBASEDEF, O2SAT CBG (last 3)   Recent Labs  08/08/14 1652 08/09/14 0004 08/09/14 0555  GLUCAP 118* 155* 130*    Assessment/Plan: S/P Procedure(s) (LRB): FOREIGN BODY REMOVAL ( ESOPHAGEAL MASS ) (N/A) -  Esophageal perforation due to retained foreign body. His swallow today shows improvement but there is still a small contained leak This should heal with continued conservative management He does need to remain NPO, but can have small amounts of ice chips Hopefully arrangements can be made for home TPN   LOS: 6 days    Loreli SlotSteven C Nereyda Bowler 08/09/2014

## 2014-08-09 NOTE — Progress Notes (Signed)
Patient ID: [name] Daugherty, male   DOB: 06/09/1955, 59 y.o.   MRN: [id number] [facility] PROGRESS NOTE  [name] Daugherty [id number] DOB: 12/20/1955 DOA: 08/03/2014 PCP: No PCP Per Patient     Brief narrative:    59 year old male with no significant past medical history who [facility] with intermittent dysphagia. GI has seen the patient in consultation and performed EGD 08/03/2014 but could not have dislodged the foreign object and recommended surgical evaluation.    HPI/Subjective:  Walking in hallway, appears comfortable, patient denies any headache, no chest pain. No shortness of breath or weakness. Minimal upper epigastric substernal pain.   Assessment/Plan:    Active Problems: Dysphagia secondary to foreign object / esophageal perforation / Leukocytosis - CT abdomen showed distal esophageal wall thickening, ? mass/ neoplasm/ ? foreign object rather than a mass - EGD unable to have object dislodged and removed - Status post removal of the foreign object by Dr. Dorris FetchHendrickson ([facility]) with subsequent esophageal perforation  - Started zosyn 08/04/2014 - Repeat gastrograffin swallow study Thursday 3/31 was contained distal esophageal perforation. For now nothing by mouth we'll defer resuming diet orally to GI and cardiothoracic surgery. -For now continue IV PPI & TNA continue     DVT Prophylaxis  - SCD's bilaterally      Code Status: Full.  Family Communication: plan of care discussed with the patient and his family at the bedside  Disposition Plan: not ready for discharge, on IV zosyn, still NPO   IV access:  Peripheral IV  Procedures and diagnostic studies:     Ct Abdomen Pelvis W Contrast 08/03/2014 Distal esophageal wall thickening. Cannot exclude esophageal mass/ neoplasm. Recommend further evaluation with endoscopy. Bibasilar atelectasis. 08/03/2014    Distal esophageal wall thickening. Cannot exclude esophageal mass/ neoplasm. Recommend further evaluation with  endoscopy.  Bibasilar atelectasis.  Electronically Signed: By: Charlett NoseKevin  Dover M.D. On: 08/03/2014 18:05   Dg Esophagus 08/04/2014   Focal distal esophageal perforation. These results were called by telephone at the time of interpretation on 08/04/2014 at 3:28 pm to Dr. Charna ElizabethJYOTHI MANN , who verbally acknowledged these results.      Medical Consultants:  Gastroenterology - Dr. Loreta AveMann  CTS - Dr. Bobbye CharlestonHendrickson    [name] Hockett K, MD  [facility] Pager [phone]  If 7PM-7AM, please contact night-coverage [email or web address] Password [email or web address] 08/09/2014, 10:02 AM   LOS: 6 days      Objective: Filed Vitals:   08/08/14 1416 08/08/14 1957 08/08/14 2133 08/09/14 0536  BP: 116/83 146/95 151/89 136/90  Pulse: 65     Temp: 97.4 F (36.3 C)  98.1 F (36.7 C) 98.2 F (36.8 C)  TempSrc: Oral  Oral Oral  Resp: 18  16 20   Height:      Weight:      SpO2: 99%  100% 99%    Intake/Output Summary (Last 24 hours) at 08/09/14 1002 Last data filed at 08/09/14 1000  Gross per 24 hour  Intake 2191.66 ml  Output      0 ml  Net 2191.66 ml    Exam:   General:  Pt is alert, follows commands appropriately, not in acute distress  Cardiovascular: Regular rate and rhythm, S1/S2 (+)  Respiratory: Clear to auscultation bilaterally, no wheezing, no crackles, no rhonchi  Abdomen: Soft, non tender, non distended, bowel sounds present  Extremities: No edema, pulses DP and PT palpable bilaterally  Neuro: Grossly nonfocal  Data Reviewed: Basic Metabolic Panel:  Recent Labs Lab 08/04/14 0516 08/06/14 0435  08/07/14 0630 08/08/14 1030 08/09/14 0515  NA 137 139 141 139 138  Daugherty 4.0 4.3 4.0 3.9 3.4*  CL 105 106 106 107 104  CO2 GLUCOSE 86 86 79 119* 133*  BUN 15 24* 26* 17 14  CREATININE 1.16 1.33 1.27 1.07 1.09  CALCIUM 8.6 8.5 8.9 8.8 8.8  MG  --   --  2.1 2.0 2.2  PHOS  --   --  3.4 3.1 3.8   Liver Function Tests:  Recent Labs Lab 08/03/14 1044 08/07/14 0630 08/08/14 1030  08/09/14 0515  AST 27 27 33 40*  ALT 32  ALKPHOS 51 40 39 37*  BILITOT 2.0* 1.9* 1.4* 0.9  PROT 7.8 7.2 7.3 7.2  ALBUMIN 3.6 3.1* 3.1* 3.0*    Recent Labs Lab 08/03/14 1044  LIPASE 27   No results for input(s): AMMONIA in the last 168 hours. CBC:  Recent Labs Lab 08/03/14 1044 08/04/14 0516 08/06/14 0435 08/07/14 0630  WBC 13.0* 12.6* 10.5 8.8  NEUTROABS 9.9*  --   --   --   HGB 16.7 15.4 16.0 15.5  HCT 48.0 45.3 46.0 44.8  MCV 90.2 89.5 88.6 88.5  PLT 134* 131* 157 170   Cardiac Enzymes: No results for input(s): CKTOTAL, CKMB, CKMBINDEX, TROPONINI in the last 168 hours. BNP: Invalid input(s): POCBNP CBG:  Recent Labs Lab 08/08/14 0554 08/08/14 1153 08/08/14 1652 08/09/14 0004 08/09/14 0555  GLUCAP 127* 133* 118* 155* 130*    Recent Results (from the past 240 hour(s))  Clostridium Difficile by PCR     Status: None   Collection Time: 08/07/14  6:55 PM  Result Value Ref Range Status   C difficile by pcr NEGATIVE NEGATIVE Final     Scheduled Meds: . pantoprazole (PROTONIX) IV  40 mg Intravenous Q24H  . piperacillin-tazobactam (ZOSYN)  IV  3.375 g Intravenous Q8H  . potassium chloride  10 mEq Intravenous Q1 Hr x 4   Continuous Infusions: . sodium chloride 500 mL (08/08/14 1603)  . Marland KitchenTPN (CLINIMIX-E) Adult 75 mL/hr at 08/08/14 1802   And  . fat emulsion 240 mL (08/08/14 1802)  . Marland KitchenTPN (CLINIMIX-E) Adult     And  . fat emulsion

## 2014-08-09 NOTE — Progress Notes (Signed)
Subjective: Since I last evaluated the patient, he seems to be doing better. He denies having any abdominal pain, nausea or chest pain.  Objective: Vital signs in last 24 hours: Temp:  [97.4 F (36.3 C)-98.2 F (36.8 C)] 98.2 F (36.8 C) (03/31 0536) Pulse Rate:  [65] 65 (03/30 1416) Resp:  [16-20] 20 (03/31 0536) BP: (116-151)/(83-95) 136/90 mmHg (03/31 0536) SpO2:  [99 %-100 %] 99 % (03/31 0536) Last BM Date: 08/08/14  Intake/Output from previous day: 03/30 0701 - 03/31 0700 In: 1927.3 [I.V.:145.3; IV Piggyback:150; TPN:1631.9] Out: -  Intake/Output this shift:   General appearance: alert, cooperative, appears stated age and no distress Resp: clear to auscultation bilaterally Cardio: regular rate and rhythm, S1, S2 normal, no murmur, click, rub or gallop GI: soft, non-tender; bowel sounds normal; no masses,  no organomegaly Extremities: extremities normal, atraumatic, no cyanosis or edema  Lab Results:  Recent Labs  08/07/14 0630  WBC 8.8  HGB 15.5  HCT 44.8  PLT 170   BMET  Recent Labs  08/07/14 0630 08/08/14 1030 08/09/14 0515  NA 141 139 138  K 4.0 3.9 3.4*  CL 106 107 104  CO2 27 29 25   GLUCOSE 79 119* 133*  BUN 26* 17 14  CREATININE 1.27 1.07 1.09  CALCIUM 8.9 8.8 8.8   LFT  Recent Labs  08/07/14 0630  08/09/14 0515  PROT 7.2  < > 7.2  ALBUMIN 3.1*  < > 3.0*  AST 27  < > 40*  ALT 19  < > 32  ALKPHOS 40  < > 37*  BILITOT 1.9*  < > 0.9  BILIDIR 0.3  --   --   IBILI 1.6*  --   --   < > = values in this interval not displayed.  Medications: I have reviewed the patient's current medications.  Assessment/Plan: Focal esophageal perforation after removal of foreign body-on TNA. Will need Home Health to set him up for TNA. As per my discussion with Dr. Shelly RubensteinHenderickson, I will repeat an OP swallow in another week post discharge. .  LOS: 6 days   Asuna Peth 08/09/2014, 7:31 AM

## 2014-08-10 LAB — CBC
HCT: 43.9 % (ref 39.0–52.0)
Hemoglobin: 15.5 g/dL (ref 13.0–17.0)
MCH: 30.9 pg (ref 26.0–34.0)
MCHC: 35.3 g/dL (ref 30.0–36.0)
MCV: 87.6 fL (ref 78.0–100.0)
Platelets: 185 10*3/uL (ref 150–400)
RBC: 5.01 MIL/uL (ref 4.22–5.81)
RDW: 13.6 % (ref 11.5–15.5)
WBC: 8.3 10*3/uL (ref 4.0–10.5)

## 2014-08-10 LAB — GLUCOSE, CAPILLARY
Glucose-Capillary: 137 mg/dL — ABNORMAL HIGH (ref 70–99)
Glucose-Capillary: 140 mg/dL — ABNORMAL HIGH (ref 70–99)
Glucose-Capillary: 118 mg/dL — ABNORMAL HIGH (ref 70–99)

## 2014-08-10 LAB — BASIC METABOLIC PANEL
Anion gap: 4 — ABNORMAL LOW (ref 5–15)
BUN: 20 mg/dL (ref 6–23)
CHLORIDE: 106 mmol/L (ref 96–112)
CO2: 28 mmol/L (ref 19–32)
Calcium: 8.8 mg/dL (ref 8.4–10.5)
Creatinine, Ser: 1.11 mg/dL (ref 0.50–1.35)
GFR calc Af Amer: 83 mL/min — ABNORMAL LOW (ref >90)
GFR calc non Af Amer: 71 mL/min — ABNORMAL LOW (ref >90)
GLUCOSE: 116 mg/dL — AB (ref 70–99)
POTASSIUM: 3.8 mmol/L (ref 3.5–5.1)
Sodium: 138 mmol/L (ref 135–145)

## 2014-08-10 MED ORDER — NONFORMULARY OR COMPOUNDED ITEM: Status: DC

## 2014-08-10 MED ORDER — HEPARIN SOD (PORK) LOCK FLUSH 100 UNIT/ML IV SOLN
250.0000 [IU] | INTRAVENOUS | Status: DC | PRN
  Administered 2014-08-10: 250 [IU]

## 2014-08-10 NOTE — Discharge Instructions (Signed)
Follow with Primary MD  in 7 days   Get CBC, CMP, 2 view Chest X ray checked  by Primary MD next visit.    Activity: As tolerated with Full fall precautions use walker/cane & assistance as needed   Disposition Home     Diet: NPO  For Heart failure patients - Check your Weight same time everyday, if you gain over 2 pounds, or you develop in leg swelling, experience more shortness of breath or chest pain, call your Primary MD immediately. Follow Cardiac Low Salt Diet and 1.5 lit/day fluid restriction.   On your next visit with your primary care physician please Get Medicines reviewed and adjusted.   Please request your Prim.MD to go over all Hospital Tests and Procedure/Radiological results at the follow up, please get all Hospital records sent to your Prim MD by signing hospital release before you go home.   If you experience worsening of your admission symptoms, develop shortness of breath, life threatening emergency, suicidal or homicidal thoughts you must seek medical attention immediately by calling 911 or calling your MD immediately  if symptoms less severe.  You Must read complete instructions/literature along with all the possible adverse reactions/side effects for all the Medicines you take and that have been prescribed to you. Take any new Medicines after you have completely understood and accpet all the possible adverse reactions/side effects.   Do not drive, operating heavy machinery, perform activities at heights, swimming or participation in water activities or provide baby sitting services if your were admitted for syncope or siezures until you have seen by Primary MD or a Neurologist and advised to do so again.  Do not drive when taking Pain medications.    Do not take more than prescribed Pain, Sleep and Anxiety Medications  Special Instructions: If you have smoked or chewed Tobacco  in the last 2 yrs please stop smoking, stop any regular Alcohol  and or any  Recreational drug use.  Wear Seat belts while driving.   Please note  You were cared for by a hospitalist during your hospital stay. If you have any questions about your discharge medications or the care you received while you were in the hospital after you are discharged, you can call the unit and asked to speak with the hospitalist on call if the hospitalist that took care of you is not available. Once you are discharged, your primary care physician will handle any further medical issues. Please note that NO REFILLS for any discharge medications will be authorized once you are discharged, as it is imperative that you return to your primary care physician (or establish a relationship with a primary care physician if you do not have one) for your aftercare needs so that they can reassess your need for medications and monitor your lab values.

## 2014-08-10 NOTE — Progress Notes (Signed)
CARE MANAGEMENT NOTE 08/10/2014  Patient:  [name] Daugherty,[name]   Account Number:  [id number]  Date Initiated:  08/09/2014  Documentation initiated by:  [facility]Maryna Yeagle  Subjective/Objective Assessment:   foreign body in esophagus, esophagus perforated     Action/Plan:   discharging home with TPN   Anticipated DC Date:  08/10/2014   Anticipated DC Plan:  HOME W HOME HEALTH SERVICES      DC Planning Services  CM consult      [facility]  DURABLE MEDICAL EQUIPMENT  HOME HEALTH   Choice offered to / List presented to:  C-4 Adult Children   DME arranged  IV PUMP/EQUIPMENT      DME agency  [facility]     [facility]      [facility]   Status of service:  Completed, signed off Medicare Important Message given?   (If response is "NO", the following Medicare IM given date fields will be blank) Date Medicare IM given:   Medicare IM given by:   Date Additional Medicare IM given:   Additional Medicare IM given by:    Discharge Disposition:  HOME W HOME HEALTH SERVICES  Per UR Regulation:  Reviewed for med. necessity/level of care/duration of stay  If discussed at Long Length of Stay Meetings, dates discussed:    Comments:  08/10/14 Spoke with patient 's son [name] Daugherty [phone],who acts as interpreter, he selected [facility] for home TPN. Verified patient's address and that the son lives with the patient and would be able to assist .Contacted [name] at [facility] and set up home TPN and HHRN.

## 2014-08-10 NOTE — Discharge Summary (Signed)
[name] Daugherty, is a 59 y.o. male  DOB March 29, 1956  MRN [id number].  Admission date:  08/03/2014  Admitting Physician  Charna Elizabeth, MD  Discharge Date:  08/10/2014   Primary MD  No PCP Per Patient  Recommendations for primary care physician for things to follow:   Must follow with GI and CT surgery closely   Admission Diagnosis  Dysphagia [R13.10] Food impaction of esophagus, initial encounter [T18.128A]   Discharge Diagnosis  Dysphagia [R13.10] Food impaction of esophagus, initial encounter [X32.440N]     Active Problems:   Dysphagia      Past Medical History  Diagnosis Date  . Hypertension     Past Surgical History  Procedure Laterality Date  . Esophagogastroduodenoscopy N/A 08/04/2014    Procedure: FOREIGN BODY REMOVAL ( ESOPHAGEAL MASS );  Surgeon: Charna Elizabeth, MD;  Location: [facility] OR;  Service: Endoscopy;  Laterality: N/A;  . Esophagogastroduodenoscopy Left 08/03/2014    Procedure: ESOPHAGOGASTRODUODENOSCOPY (EGD);  Surgeon: Charna Elizabeth, MD;  [facility];  Service: Endoscopy;  Laterality: Left;       History of present illness and  Hospital Course:     Kindly see H&P for history of present illness and admission details, please review complete Labs, Consult reports and Test reports for all details in brief  HPI  from the history and physical done on the day of admission   59 year old male with no significant past medical history who presented to [facility] ED with intermittent dysphagia. GI has seen the patient in consultation and performed EGD 08/03/2014 but could not have dislodged the foreign object and recommended surgical evaluation.    Hospital Course    Dysphagia secondary to foreign object / esophageal perforation / Leukocytosis  - CT abdomen showed distal esophageal wall thickening, ?  mass/ neoplasm/ ? foreign object rather than a mass,  EGD unable to have object dislodged and removed - Status post removal of the foreign object by Dr. Dorris Fetch (CTVS) with subsequent esophageal perforation, off ABX now, cleared by GI and CT Surgery to DC in IV TPN via PICC. -He will get repeat B.Swallow by GI in 1 week, NPO till then.    Discharge Condition: Stable   Follow UP  Follow-up Information    Follow up with [facility]    . Schedule an appointment as soon as possible for a visit in 1 week.   Contact information:   [address] [place] [address] [phone]      Follow up with Charna Elizabeth, MD. Schedule an appointment as soon as possible for a visit in 1 week.   Specialty:  Gastroenterology   Contact information:   [address] [place] [address] [phone]       Follow up with Loreli Slot, MD. Schedule an appointment as soon as possible for a visit in 1 week.   Specialty:  Cardiothoracic Surgery   Contact information:   [address] [employer] [address] [place] [address]  [phone]         Discharge Instructions  and  Discharge Medications      Discharge Instructions    Discharge instructions    Complete by:  As directed   Follow with Primary MD  in 7 days   Get CBC, CMP, 2 view Chest X ray checked  by Primary MD next visit.    Activity: As tolerated with Full fall precautions use walker/cane & assistance as needed   Disposition Home     Diet: NPO  For Heart failure patients - Check your Weight same time everyday, if you gain over 2 pounds, or you develop in leg swelling, experience more shortness of breath or chest pain, call your Primary MD immediately. Follow Cardiac Low Salt Diet and 1.5 lit/day fluid restriction.   On your next visit with your primary care physician please Get Medicines reviewed and adjusted.   Please request your Prim.MD to go over  all Hospital Tests and Procedure/Radiological results at the follow up, please get all Hospital records sent to your Prim MD by signing hospital release before you go home.   If you experience worsening of your admission symptoms, develop shortness of breath, life threatening emergency, suicidal or homicidal thoughts you must seek medical attention immediately by calling 911 or calling your MD immediately  if symptoms less severe.  You Must read complete instructions/literature along with all the possible adverse reactions/side effects for all the Medicines you take and that have been prescribed to you. Take any new Medicines after you have completely understood and accpet all the possible adverse reactions/side effects.   Do not drive, operating heavy machinery, perform activities at heights, swimming or participation in water activities or provide baby sitting services if your were admitted for syncope or siezures until you have seen by Primary MD or a Neurologist and advised to do so again.  Do not drive when taking Pain medications.    Do not take more than prescribed Pain, Sleep and Anxiety Medications  Special Instructions: If you have smoked or chewed Tobacco  in the last 2 yrs please stop smoking, stop any regular Alcohol  and or any Recreational drug use.  Wear Seat belts while driving.   Please note  You were cared for by a hospitalist during your hospital stay. If you have any questions about your discharge medications or the care you received while you were in the hospital after you are discharged, you can call the unit and asked to speak with the hospitalist on call if the hospitalist that took care of you is not available. Once you are discharged, your primary care physician will handle any further medical issues. Please note that NO REFILLS for any discharge medications will be authorized once you are discharged, as it is imperative that you return to your primary care physician  (or establish a relationship with a primary care physician if you do not have one) for your aftercare needs so that they can reassess your need for medications and monitor your lab values.     Increase activity slowly    Complete by:  As directed             Medication List    TAKE these medications        NONFORMULARY OR COMPOUNDED ITEM  TPN 75cc/hr for Home TPN Via PICC, labs and doing can be done per [facility]          Diet and Activity recommendation: See Discharge Instructions  above   Consults obtained - CTS, GI   Major procedures and Radiology Reports - PLEASE review detailed and final reports for all details, in brief -       Ct Abdomen Pelvis W Contrast  08/03/2014   ADDENDUM REPORT: 08/03/2014 19:01  ADDENDUM: After original interpretation, the case was discussed with Dr. Loreta Ave. Dr. Loreta Ave recently performed endoscopy on the patient, noting a hard foreign body within the distal esophagus. Upon review, what was originally felt to represent contrast within the lumen of the distal esophagus corresponds to the foreign body noted by endoscopy. On sagittal and coronal reconstructed images, this appears to be a flat transversely oriented foreign body imbedded within the walls of the distal esophagus in the area of wall thickening. Exact type of foreign body cannot be determined, but this could represent an impacted bone or other calcified foreign body. This appears less likely to be a mass on reconstructed images.   Electronically Signed   By: Charlett Nose M.D.   On: 08/03/2014 19:01   08/03/2014   CLINICAL DATA:  Upper abdominal pain. Dysphagia. Evaluate for mass at the GE junction.  EXAM: CT ABDOMEN AND PELVIS WITH CONTRAST  TECHNIQUE: Multidetector CT imaging of the abdomen and pelvis was performed using the standard protocol following bolus administration of intravenous contrast.  CONTRAST:  100 OMNIPAQUE IOHEXOL 300 MG/ML  SOLN  COMPARISON:  None.  FINDINGS: Dependent  atelectasis in the lung bases. Heart is normal size. No effusions.  The distal esophageal wall appears thickened above the GE junction, measuring up to 13 mm in thickness. Cannot exclude esophageal mass.  Liver, gallbladder, spleen, pancreas, adrenals and kidneys are unremarkable. No renal stones or ureteral stones. No hydronephrosis. Urinary bladder is unremarkable.  Stomach, duodenal bulb and duodenal sweep are unremarkable. Appendix is visualized and is normal. No free fluid, free air or adenopathy. Aorta is normal caliber.  No acute bony abnormality or focal bone lesion.  IMPRESSION: Distal esophageal wall thickening. Cannot exclude esophageal mass/ neoplasm. Recommend further evaluation with endoscopy.  Bibasilar atelectasis.  Electronically Signed: By: Charlett Nose M.D. On: 08/03/2014 18:05   Dg Esophagus  08/04/2014   CLINICAL DATA:  Impacted ingested foreign body within the distal esophagus, extracted endoscopically earlier today. Evaluate for possible perforation  EXAM: ESOPHOGRAM/BARIUM SWALLOW  TECHNIQUE: Single contrast examination was performed using single contrast water soluble technique.  FLUOROSCOPY TIME:  1 min 20 seconds  COMPARISON:  Chest radiograph 08/18/2013  FINDINGS: There is focal extravasation of oral contrast from the distal esophagus and communication with the esophageal lumen on real-time imaging. Contrast passes without obstruction from the distal esophagus into the grossly normal-appearing stomach, although the stomach was not further evaluated.  IMPRESSION: Focal distal esophageal perforation. These results were called by telephone at the time of interpretation on 08/04/2014 at 3:28 pm to Dr. Charna Elizabeth , who verbally acknowledged these results.   Electronically Signed   By: Christiana Pellant M.D.   On: 08/04/2014 15:30   Dg Esophagus W/water Sol Cm  08/09/2014   CLINICAL DATA:  Esophageal perforation with recent removal of bone foreign body  EXAM: ESOPHOGRAM/BARIUM SWALLOW   TECHNIQUE: Single contrast examination was performed using Omnipaque water-soluble contrast.  FLUOROSCOPY TIME:  Radiation Exposure Index (as provided by the fluoroscopic device):  If the device does not provide the exposure index:  Fluoroscopy Time:  1 min 12 seconds  Number of Acquired Images:  COMPARISON:  Esophagram 08/04/2014  FINDINGS: The esophagus is patent without significant  obstruction. Distal esophageal edema has improved. There remains a perforation of the distal esophagus projecting to the left of midline. This is a contained perforation and is slightly improved from the prior study. No stricture noted. Mild esophageal dysmotility. Small hiatal hernia.  IMPRESSION: Persistent contained esophageal perforation distal esophagus, slightly improved from the prior study. Esophageal edema has improved.  Small hiatal hernia.  No obstruction or stricture.   Electronically Signed   By: Marlan Palau M.D.   On: 08/09/2014 09:07    Micro Results      Recent Results (from the past 240 hour(s))  Clostridium Difficile by PCR     Status: None   Collection Time: 08/07/14  6:55 PM  Result Value Ref Range Status   C difficile by pcr NEGATIVE NEGATIVE Final       Today   Subjective:   Daleon Macy today has no headache,no chest abdominal pain,no new weakness tingling or numbness, feels much better wants to go home today.    Objective:   Blood pressure 134/89, pulse 58, temperature 98.2 F (36.8 C), temperature source Oral, resp. rate 18, height  (1.676 m), weight 65 kg (143 lb 4.8 oz), SpO2 98 %.   Intake/Output Summary (Last 24 hours) at 08/10/14 1008 Last data filed at 08/09/14 1821  Gross per 24 hour  Intake 875.34 ml  Output      0 ml  Net 875.34 ml    Exam Awake Alert, Oriented x 3, No new F.N deficits, Normal affect [place].AT,PERRAL Supple Neck,No JVD, No cervical lymphadenopathy appriciated.  Symmetrical Chest wall movement, Good air movement bilaterally, CTAB RRR,No  Gallops,Rubs or new Murmurs, No Parasternal Heave +ve B.Sounds, Abd Soft, Non tender, No organomegaly appriciated, No rebound -guarding or rigidity. No Cyanosis, Clubbing or edema, No new Rash or bruise  Data Review   CBC w Diff: Lab Results  Component Value Date   WBC 8.3 08/10/2014   HGB 15.5 08/10/2014   HCT 43.9 08/10/2014   PLT 185 08/10/2014   LYMPHOPCT 13 08/03/2014   MONOPCT 8 08/03/2014   EOSPCT 2 08/03/2014   BASOPCT 0 08/03/2014    CMP: Lab Results  Component Value Date   NA 138 08/10/2014   K 3.8 08/10/2014   CL 106 08/10/2014   CO2 28 08/10/2014   BUN 20 08/10/2014   CREATININE 1.11 08/10/2014   PROT 7.2 08/09/2014   ALBUMIN 3.0* 08/09/2014   BILITOT 0.9 08/09/2014   ALKPHOS 37* 08/09/2014   AST 40* 08/09/2014   ALT 32 08/09/2014  .   Total Time in preparing paper work, data evaluation and todays exam - 35 minutes  Leroy Sea M.D on 08/10/2014 at 10:08 AM  [facility]   Office  [phone]

## 2014-08-10 NOTE — Progress Notes (Signed)
Patient was discharged home with home health by MD order; discharged instructions  review and give to patient with care notes;PICC line on right upper arm - patient continue TPN at home; skin intact; patient with family at bedside, will be escorted to the car by nurse tech via wheelchair.

## 2014-08-13 ENCOUNTER — Other Ambulatory Visit: Payer: Self-pay | Admitting: Gastroenterology

## 2014-08-13 DIAGNOSIS — K223 Perforation of esophagus: Secondary | ICD-10-CM

## 2014-08-14 ENCOUNTER — Ambulatory Visit (HOSPITAL_COMMUNITY)
Admission: RE | Admit: 2014-08-14 | Discharge: 2014-08-14 | Disposition: A | Discharge status: Home / Self Care | Payer: 59 | Source: Ambulatory Visit | Attending: Family Medicine | Admitting: Family Medicine

## 2014-08-14 ENCOUNTER — Ambulatory Visit: Payer: 59 | Attending: Family Medicine | Admitting: Family Medicine

## 2014-08-14 ENCOUNTER — Encounter: Payer: Self-pay | Admitting: Family Medicine

## 2014-08-14 VITALS — BP 114/80 | HR 65 | Temp 97.6°F | Resp 18 | Wt 148.4 lb

## 2014-08-14 DIAGNOSIS — IMO0002 Reserved for concepts with insufficient information to code with codable children: Secondary | ICD-10-CM

## 2014-08-14 DIAGNOSIS — Y849 Medical procedure, unspecified as the cause of abnormal reaction of the patient, or of later complication, without mention of misadventure at the time of the procedure: Secondary | ICD-10-CM | POA: Insufficient documentation

## 2014-08-14 DIAGNOSIS — K9172 Accidental puncture and laceration of a digestive system organ or structure during other procedure: Secondary | ICD-10-CM | POA: Insufficient documentation

## 2014-08-14 DIAGNOSIS — K449 Diaphragmatic hernia without obstruction or gangrene: Secondary | ICD-10-CM | POA: Diagnosis not present

## 2014-08-14 DIAGNOSIS — K223 Perforation of esophagus: Secondary | ICD-10-CM | POA: Diagnosis not present

## 2014-08-14 DIAGNOSIS — K9171 Accidental puncture and laceration of a digestive system organ or structure during a digestive system procedure: Secondary | ICD-10-CM | POA: Insufficient documentation

## 2014-08-14 DIAGNOSIS — R1310 Dysphagia, unspecified: Secondary | ICD-10-CM | POA: Diagnosis not present

## 2014-08-14 LAB — CBC WITH DIFFERENTIAL/PLATELET
BASOS ABS: 0.1 10*3/uL (ref 0.0–0.1)
BASOS PCT: 1 % (ref 0–1)
EOS PCT: 1 % (ref 0–5)
Eosinophils Absolute: 0.1 10*3/uL (ref 0.0–0.7)
HCT: 49.8 % (ref 39.0–52.0)
Hemoglobin: 16.9 g/dL (ref 13.0–17.0)
Lymphocytes Relative: 25 % (ref 12–46)
Lymphs Abs: 2.3 10*3/uL (ref 0.7–4.0)
MCH: 30.7 pg (ref 26.0–34.0)
MCHC: 33.9 g/dL (ref 30.0–36.0)
MCV: 90.4 fL (ref 78.0–100.0)
MPV: 11.9 fL (ref 8.6–12.4)
Monocytes Absolute: 0.7 10*3/uL (ref 0.1–1.0)
Monocytes Relative: 8 % (ref 3–12)
Neutro Abs: 5.9 10*3/uL (ref 1.7–7.7)
Neutrophils Relative %: 65 % (ref 43–77)
PLATELETS: 213 10*3/uL (ref 150–400)
RBC: 5.51 MIL/uL (ref 4.22–5.81)
RDW: 13.6 % (ref 11.5–15.5)
WBC: 9 10*3/uL (ref 4.0–10.5)

## 2014-08-14 LAB — COMPREHENSIVE METABOLIC PANEL
ALT: 87 U/L — ABNORMAL HIGH (ref 0–53)
AST: 62 U/L — ABNORMAL HIGH (ref 0–37)
Albumin: 4.1 g/dL (ref 3.5–5.2)
Alkaline Phosphatase: 55 U/L (ref 39–117)
BUN: 29 mg/dL — ABNORMAL HIGH (ref 6–23)
CALCIUM: 9.4 mg/dL (ref 8.4–10.5)
CO2: 27 mEq/L (ref 19–32)
Chloride: 101 mEq/L (ref 96–112)
Creat: 0.99 mg/dL (ref 0.50–1.35)
GLUCOSE: 92 mg/dL (ref 70–99)
POTASSIUM: 5 meq/L (ref 3.5–5.3)
Sodium: 137 mEq/L (ref 135–145)
TOTAL PROTEIN: 7.8 g/dL (ref 6.0–8.3)
Total Bilirubin: 1 mg/dL (ref 0.2–1.2)

## 2014-08-14 NOTE — Progress Notes (Signed)
Patient here for hospital follow up. Hospitalized for dysphagia.  Patient currently infusing TPN via PICC line in right upper arm. Patient reports not eating.  Patient denies pain at this time. Patient does have pain that comes and goes in abdominal area, can be at level 4-5 at times.

## 2014-08-14 NOTE — Progress Notes (Signed)
Subjective:    Patient ID: [name] Daugherty, male    DOB: 07/14/1955, 59 y.o.   MRN: [id number]  HPI  59 year old male patient who comes in here to establish care and is seen with the aid of a language line and Nepali interpreter Melynda KellerSam Gotham [phone]#[id number]. He was hospitalized at [facility] between 08/03/2014 and 4//2016 after he had presented with dysphagia have been ongoing for 2 years, he had a CT of his abdomen which revealed a foreign object which could not be removed via upper endoscopy and so CT surgery consult was placed and he had  removal of foreign object but with subsequent accidental esophageal perforation. He was placed on antibiotics which he has completed and also placed nothing by mouth and is currently receiving IV TPN via PICC line.  Interval History: He does admit to occasional chest pain at the base of his  Sternum; [facility] to the house to assist with his TPN. Has an upcoming appointment with GI - Dr Loreta AveMann on 08/16/14 and CT Surgery Dr Dorris FetchHendrickson on 08/21/14  Past Medical History  Diagnosis Date  . Hypertension      Past Surgical History  Procedure Laterality Date  . Esophagogastroduodenoscopy N/A 08/04/2014    Procedure: FOREIGN BODY REMOVAL ( ESOPHAGEAL MASS );  Surgeon: Charna ElizabethJyothi Mann, MD;  Location: [facility] OR;  Service: Endoscopy;  Laterality: N/A;  . Esophagogastroduodenoscopy Left 08/03/2014    Procedure: ESOPHAGOGASTRODUODENOSCOPY (EGD);  Surgeon: Charna ElizabethJyothi Mann, MD;  [facility];  Service: Endoscopy;  Laterality: Left;     History reviewed. No pertinent family history.  No Known Allergies   Current Outpatient Prescriptions on File Prior to Visit  Medication Sig Dispense Refill  . NONFORMULARY OR COMPOUNDED ITEM TPN 75cc/hr for Home TPN Via PICC, labs and doing can be done per [facility] protocol 1 each 0   No current facility-administered medications on file prior to visit.      Review of Systems  Constitutional: Positive for appetite  change. Negative for activity change.       He is NPO  HENT: Negative for sinus pressure and sore throat.   Eyes: Negative for visual disturbance.  Respiratory: Negative for chest tightness and shortness of breath.   Cardiovascular: Positive for chest pain. Negative for palpitations.  Gastrointestinal: Negative for abdominal pain and abdominal distention.  Endocrine: Negative for cold intolerance, heat intolerance and polyphagia.  Genitourinary: Negative for dysuria, frequency and difficulty urinating.  Musculoskeletal: Negative for back pain, joint swelling and arthralgias.  Skin: Negative for color change.  Neurological: Negative for dizziness, tremors and weakness.  Psychiatric/Behavioral: Negative for suicidal ideas and behavioral problems.         Objective:  Filed Vitals:   08/14/14 0931  BP: 114/80  Pulse: 65  Temp: 97.6 F (36.4 C)  Resp: 18        Physical Exam  Constitutional: He is oriented to person, place, and time. He appears well-developed and well-nourished.  HENT:  Head: Normocephalic and atraumatic.  Right Ear: External ear normal.  Left Ear: External ear normal.  Eyes: Conjunctivae and EOM are normal. Pupils are equal, round, and reactive to light.  Neck: Normal range of motion. Neck supple. No tracheal deviation present.  Cardiovascular: Normal rate, regular rhythm and normal heart sounds.   No murmur heard. Pulmonary/Chest: Effort normal and breath sounds normal. No respiratory distress. He has no wheezes.  Mild tenderness on palpation of tip of sternum  Abdominal: Soft. Bowel sounds  are normal. He exhibits no mass. There is no tenderness.  Musculoskeletal: Normal range of motion. He exhibits no edema or tenderness.  Neurological: He is alert and oriented to person, place, and time.  Skin:  PICC line on flexor aspect of right forearm  Psychiatric: He has a normal mood and affect.     CT abdomen and Pelvis w/ contrast:  IMPRESSION: Distal  esophageal wall thickening. Cannot exclude esophageal mass/ neoplasm. Recommend further evaluation with endoscopy. Bibasilar atelectasis.  DG Esophagus W/Water Sol:G Esophagus W/Water Sol CM IMPRESSION: Persistent contained esophageal perforation distal esophagus, slightly improved from the prior study. Esophageal edema has improved.  Small hiatal hernia. No obstruction or stricture.      Assessment & Plan:  Patient with a history of esophageal perforation secondary to removal of superficial foreign body currently nothing by mouth receiving nutrition via TPN.  Dysphagia: He is currently nothing by mouth. Scheduled to see GI for follow-up barium swallow this week I am ordering a CBC and a CMP as well as a follow-up chest x-ray.  Sufficient preparation:: Scheduled to see CT surgery next week. Continue TPN as per schedule.

## 2014-08-15 ENCOUNTER — Ambulatory Visit (HOSPITAL_COMMUNITY)
Admission: RE | Admit: 2014-08-15 | Discharge: 2014-08-15 | Disposition: A | Discharge status: Home / Self Care | Payer: 59 | Source: Ambulatory Visit | Attending: Gastroenterology | Admitting: Gastroenterology

## 2014-08-15 DIAGNOSIS — K223 Perforation of esophagus: Secondary | ICD-10-CM | POA: Insufficient documentation

## 2014-08-15 MED ORDER — IOHEXOL 300 MG/ML  SOLN
150.0000 mL | Freq: Once | INTRAMUSCULAR | Status: AC | PRN
  Administered 2014-08-15: 150 mL via ORAL

## 2014-08-16 ENCOUNTER — Other Ambulatory Visit: Payer: Self-pay | Admitting: Gastroenterology

## 2014-08-16 DIAGNOSIS — K223 Perforation of esophagus: Secondary | ICD-10-CM

## 2014-08-17 ENCOUNTER — Telehealth: Payer: Self-pay

## 2014-08-17 NOTE — Telephone Encounter (Signed)
-----   Message from Jaclyn ShaggyEnobong Amao, MD sent at 08/16/2014 12:49 PM EDT ----- Please inform him that his liver enzymes are mildly elevated compared to previous labs; we will continue to monitor. Thank you

## 2014-08-17 NOTE — Telephone Encounter (Signed)
-----   Message from Enobong Amao, MD sent at 08/16/2014 12:49 PM EDT ----- Please inform him that his liver enzymes are mildly elevated compared to previous labs; we will continue to monitor. Thank you 

## 2014-08-17 NOTE — Telephone Encounter (Signed)
Nurse called patient, patient verified DOB. Nurse informed patient of liver enzymes being mildly elevated and we will continue to monitor. Patient voices understanding.

## 2014-08-20 ENCOUNTER — Other Ambulatory Visit: Payer: Self-pay | Admitting: Thoracic Surgery (Cardiothoracic Vascular Surgery)

## 2014-08-20 DIAGNOSIS — T18128A Food in esophagus causing other injury, initial encounter: Principal | ICD-10-CM

## 2014-08-20 DIAGNOSIS — K222 Esophageal obstruction: Secondary | ICD-10-CM

## 2014-08-21 ENCOUNTER — Encounter: Payer: Self-pay | Admitting: Thoracic Surgery (Cardiothoracic Vascular Surgery)

## 2014-08-21 ENCOUNTER — Ambulatory Visit (INDEPENDENT_AMBULATORY_CARE_PROVIDER_SITE_OTHER): Payer: 59 | Admitting: Thoracic Surgery (Cardiothoracic Vascular Surgery)

## 2014-08-21 ENCOUNTER — Ambulatory Visit
Admission: RE | Admit: 2014-08-21 | Discharge: 2014-08-21 | Disposition: A | Discharge status: Home / Self Care | Payer: 59 | Source: Ambulatory Visit | Attending: Thoracic Surgery (Cardiothoracic Vascular Surgery) | Admitting: Thoracic Surgery (Cardiothoracic Vascular Surgery)

## 2014-08-21 ENCOUNTER — Ambulatory Visit: Payer: 59 | Admitting: Thoracic Surgery (Cardiothoracic Vascular Surgery)

## 2014-08-21 VITALS — BP 134/92 | HR 65 | Resp 16 | Ht 65.0 in | Wt 155.5 lb

## 2014-08-21 DIAGNOSIS — K223 Perforation of esophagus: Secondary | ICD-10-CM

## 2014-08-21 DIAGNOSIS — K222 Esophageal obstruction: Secondary | ICD-10-CM

## 2014-08-21 DIAGNOSIS — T18128A Food in esophagus causing other injury, initial encounter: Principal | ICD-10-CM

## 2014-08-21 HISTORY — DX: Perforation of esophagus: K22.3

## 2014-08-21 NOTE — Progress Notes (Signed)
      [address]       [place] [id number]             [phone]       HPI:  [name] Daugherty is a 89 -year-old man originally from [place].  He was hospitalized last month after getting a foreign body impacted in his esophagus. Dr. Loreta AveMann was able to remove the foreign body endoscopically. It was clear that time of removal that the foreign body had caused a perforation of the mucosa. A Gastrografin swallow demonstrated a contained leak and he was managed nonoperatively with intravenous antibiotics and intravenous nutrition.  He was doing well and was discharged home. A repeat swallow study on 08/15/2014 showed no change. We did allow him to start drinking water at that time. He has not had any problems with swallowing the water. He's not had any fevers, chills, sweats, or chest pain.  Past Medical History  Diagnosis Date  . Hypertension   . Esophageal perforation 08/21/2014    Due to retained foreign body       Current Outpatient Prescriptions  Medication Sig Dispense Refill  . NONFORMULARY OR COMPOUNDED ITEM TPN 75cc/hr for Home TPN Via PICC, labs and doing can be done per [facility] protocol 1 each 0   No current facility-administered medications for this visit.    Physical Exam BP 134/92 mmHg  Pulse 65  Resp 16  Ht 5\' 5"  (1.651 m)  Wt 155 lb 8 oz (70.534 kg)  BMI 25.88 kg/m2  SpO242 7298% 59 year old man in no acute distress Cardiac regular rate and rhythm normal S1 and S2 Lungs clear Abdomen soft nontender with positive bowel sounds  Diagnostic Tests: Swallow from 08/15/2014 reviewed  Impression: [name] Daugherty is a 59 year old gentleman who had an impacted foreign body in his esophagus that had eroded through the mucosa. Fortunately this was contained. He has been managed nonoperatively and has done well with that. His most recent swallow still showed a contained perforation versus ulceration in that area.  I recommended that he stay nothing by mouth and continue  with intravenous nutrition. I do think he would be fine taking some clear juices, such as apple juice. I advised them to avoid any carbonation whatsoever.  Dr. Loreta AveMann has scheduled a repeat swallow on Monday, 08/27/2014. We'll reassess at that time  Plan: Barium swallow on Monday, 08/27/2014  I will see him later that afternoon after the study.  Loreli SlotSteven C Brazos Sandoval, MD [facility] [phone]

## 2014-08-23 ENCOUNTER — Ambulatory Visit (HOSPITAL_COMMUNITY)
Admission: RE | Admit: 2014-08-23 | Discharge: 2014-08-23 | Disposition: A | Discharge status: Home / Self Care | Payer: 59 | Source: Ambulatory Visit | Attending: Gastroenterology | Admitting: Gastroenterology

## 2014-08-23 ENCOUNTER — Ambulatory Visit (HOSPITAL_COMMUNITY): Payer: 59

## 2014-08-23 DIAGNOSIS — K223 Perforation of esophagus: Secondary | ICD-10-CM | POA: Insufficient documentation

## 2014-08-23 MED ORDER — IOHEXOL 300 MG/ML  SOLN
150.0000 mL | Freq: Once | INTRAMUSCULAR | Status: AC | PRN
  Administered 2014-08-23: 150 mL via ORAL

## 2014-08-27 ENCOUNTER — Ambulatory Visit: Payer: 59 | Admitting: Thoracic Surgery (Cardiothoracic Vascular Surgery)

## 2014-08-27 ENCOUNTER — Ambulatory Visit (HOSPITAL_COMMUNITY): Payer: 59

## 2014-09-14 ENCOUNTER — Other Ambulatory Visit: Payer: Self-pay | Admitting: Gastroenterology

## 2014-09-14 DIAGNOSIS — K223 Perforation of esophagus: Secondary | ICD-10-CM

## 2014-10-05 ENCOUNTER — Ambulatory Visit (HOSPITAL_COMMUNITY): Admission: RE | Admit: 2014-10-05 | Payer: 59 | Source: Ambulatory Visit

## 2014-10-05 ENCOUNTER — Ambulatory Visit (HOSPITAL_COMMUNITY): Payer: 59

## 2014-10-11 ENCOUNTER — Ambulatory Visit (HOSPITAL_COMMUNITY)
Admission: RE | Admit: 2014-10-11 | Discharge: 2014-10-11 | Disposition: A | Discharge status: Home / Self Care | Payer: 59 | Source: Ambulatory Visit | Attending: Gastroenterology | Admitting: Gastroenterology

## 2014-10-11 DIAGNOSIS — K223 Perforation of esophagus: Secondary | ICD-10-CM | POA: Diagnosis not present

## 2014-10-11 MED ORDER — IOHEXOL 300 MG/ML  SOLN
150.0000 mL | Freq: Once | INTRAMUSCULAR | Status: AC | PRN
  Administered 2014-10-11: 50 mL via ORAL

## 2015-05-30 ENCOUNTER — Other Ambulatory Visit: Payer: Self-pay | Admitting: Gastroenterology

## 2015-05-30 DIAGNOSIS — R1310 Dysphagia, unspecified: Secondary | ICD-10-CM

## 2015-05-31 ENCOUNTER — Other Ambulatory Visit: Payer: Self-pay

## 2015-05-31 ENCOUNTER — Ambulatory Visit
Admission: RE | Admit: 2015-05-31 | Discharge: 2015-05-31 | Disposition: A | Discharge status: Home / Self Care | Payer: BLUE CROSS/BLUE SHIELD | Source: Ambulatory Visit | Attending: Gastroenterology | Admitting: Gastroenterology

## 2015-05-31 DIAGNOSIS — R1310 Dysphagia, unspecified: Secondary | ICD-10-CM

## 2015-06-18 ENCOUNTER — Encounter: Payer: Self-pay | Admitting: Thoracic Surgery (Cardiothoracic Vascular Surgery)

## 2015-06-18 ENCOUNTER — Ambulatory Visit (INDEPENDENT_AMBULATORY_CARE_PROVIDER_SITE_OTHER): Payer: BLUE CROSS/BLUE SHIELD | Admitting: Thoracic Surgery (Cardiothoracic Vascular Surgery)

## 2015-06-18 ENCOUNTER — Other Ambulatory Visit: Payer: Self-pay | Admitting: *Deleted

## 2015-06-18 VITALS — BP 182/107 | HR 66 | Resp 20 | Ht 65.0 in | Wt 160.0 lb

## 2015-06-18 DIAGNOSIS — K225 Diverticulum of esophagus, acquired: Secondary | ICD-10-CM | POA: Diagnosis not present

## 2015-06-18 DIAGNOSIS — R1310 Dysphagia, unspecified: Secondary | ICD-10-CM

## 2015-06-18 DIAGNOSIS — K223 Perforation of esophagus: Secondary | ICD-10-CM

## 2015-06-18 NOTE — Progress Notes (Signed)
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  He is accompanied by a professional interpreter.  He presented back in March 2016 with a contained esophageal perforation secondary to a retained foreign body. Dr. Loreta Ave was able to remove the foreign body endoscopically. He did not require surgery.  He had a follow-up barium swallow on 08/27/2014 which showed no residual leak. He was able to resume by mouth intake. He says that since that time he has had difficulty and pain with swallowing. He says that he has trouble with both solids and liquids, including water. He feels that things often stick. He has to chew his food very carefully and limited himself to soft foods. He has not lost significant weight, but has to eat "all the time." He does sometimes have pain just with swallowing his own saliva. He says he is not able to work because he has to stop and eat so frequently. He has financial concerns because of that.   Reviewing my consultation note from his first admission he was having difficulty with swallowing prior to that episode.  Past Medical History  Diagnosis Date  . Hypertension   . Esophageal perforation 08/21/2014    Due to retained foreign body    Past Surgical History  Procedure Laterality Date  . Esophagogastroduodenoscopy N/A 08/04/2014    Procedure: FOREIGN BODY REMOVAL ( ESOPHAGEAL MASS );  Surgeon: Charna Elizabeth, MD;  Location: [facility] OR;  Service: Endoscopy;  Laterality: N/A;  . Esophagogastroduodenoscopy Left 08/03/2014    Procedure: ESOPHAGOGASTRODUODENOSCOPY (EGD);  Surgeon: Charna Elizabeth, MD;  [facility] END[facility];  Service: Endoscopy;  Laterality: Left;     Current Outpatient Prescriptions  Medication Sig Dispense Refill  . pantoprazole (PROTONIX) 40 MG tablet TK 1 T PO QD 20 MINUTES BEFORE BREAKFAST  12   No current facility-administered  medications for this visit.    Physical Exam BP 182/107 mmHg  Pulse 66  Resp 20  Ht  (1.651 m)  Wt 160 lb (72.576 kg)  BMI 26.63 kg/m2  SpO56 91% 60 year old [place] man, very anxious, no acute distress Alert with no focal neurologic deficit No cervical or subclavicular adenopathy Cardiac regular rate and rhythm normal S1 and S2 Lungs clear with equal breath sounds bilaterally Abdomen soft nontender  Diagnostic Tests: ESOPHOGRAM/BARIUM SWALLOW  TECHNIQUE: Single contrast examination was performed using water-soluble contrast followed by thin barium.  FLUOROSCOPY TIME: Radiation Exposure Index (as provided by the fluoroscopic device): 34 dGycm2  If the device does not provide the exposure index:  Fluoroscopy Time: 1 minutes and 6 seconds.  Number of Acquired Images: 1 rapid sequence series comprising of 13 images. Additional images obtained with fluoro store.  COMPARISON: Esophagram 10/11/2014 and 08/23/2014. Abdominal CT 08/03/2014.  FINDINGS: Fluoroscopy of the lower chest prior to contrast administration demonstrates no foreign bodies in the posterior mediastinum. Patient drank the water-soluble contrast without difficulty. There is persistent filling of a broad-based diverticulum projecting to the left of the lower esophageal lumen. This is at the site of the previously demonstrated contained perforation. Subsequently, thin barium was administered, showing similar findings. There is no esophageal perforation.  Mild presbyesophagus and esophageal spasm noted. There is smooth narrowing of the distal esophageal lumen. A barium tablet was not administered.  IMPRESSION: 1. At the  site of the previous demonstrated distal esophageal perforation, there is a broad-based diverticulum projecting to the left of the lower esophageal lumen. 2. No evidence of esophageal perforation. 3. Smooth narrowing of the distal esophagus. Endoscopic follow-up should  be considered.   Electronically Signed  By: Carey Bullocks M.D.  On: 05/31/2015 14:04  I personally reviewed the barium swallow and concur with sizes noted above. There is a small distal esophageal diverticulum.  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 He is having severe dysphagia and odynophagia and is having difficulty with both liquids and solids. His barium swallow shows a small broad-based diverticulum in the distal esophagus. There was some mild presbyesophagus and esophageal spasm and smooth narrowing of the distal esophagus noted as well.  His symptoms seem out of proportion to any of the findings on the swallow. In particular it's hard for me to imagine that a small broad-based diverticulum could be causing this degree of dysphagia. His symptoms sound much more consistent with reflux or spasm.  I did talk with him about the possibility of surgery. He thinks that having surgery will fix all of his problems. I think it is unlikely based on the available information that surgery would improve his symptoms significantly. It would be a major operation with potential for significant complications or even death.   I recommended that he have a CT of the chest and abdomen with PO contrast to get a better look at the area around the esophagus.  Plan: CT chest and abdomen  Return in 3 weeks to further discuss these issues.  Loreli Slot, MD [facility] [phone]

## 2015-06-25 ENCOUNTER — Other Ambulatory Visit: Payer: Self-pay | Admitting: Thoracic Surgery (Cardiothoracic Vascular Surgery)

## 2015-06-26 ENCOUNTER — Other Ambulatory Visit: Payer: Self-pay | Admitting: Thoracic Surgery (Cardiothoracic Vascular Surgery)

## 2015-06-27 ENCOUNTER — Ambulatory Visit
Admission: RE | Admit: 2015-06-27 | Discharge: 2015-06-27 | Disposition: A | Discharge status: Home / Self Care | Payer: BLUE CROSS/BLUE SHIELD | Source: Ambulatory Visit | Attending: Thoracic Surgery (Cardiothoracic Vascular Surgery) | Admitting: Thoracic Surgery (Cardiothoracic Vascular Surgery)

## 2015-06-27 DIAGNOSIS — R1310 Dysphagia, unspecified: Secondary | ICD-10-CM

## 2015-06-27 DIAGNOSIS — K223 Perforation of esophagus: Secondary | ICD-10-CM

## 2015-07-09 ENCOUNTER — Ambulatory Visit (INDEPENDENT_AMBULATORY_CARE_PROVIDER_SITE_OTHER): Payer: BLUE CROSS/BLUE SHIELD | Admitting: Thoracic Surgery (Cardiothoracic Vascular Surgery)

## 2015-07-09 ENCOUNTER — Encounter: Payer: Self-pay | Admitting: Thoracic Surgery (Cardiothoracic Vascular Surgery)

## 2015-07-09 VITALS — BP 164/100 | HR 74 | Resp 20 | Ht 65.0 in | Wt 163.0 lb

## 2015-07-09 DIAGNOSIS — I712 Thoracic aortic aneurysm, without rupture: Secondary | ICD-10-CM | POA: Diagnosis not present

## 2015-07-09 DIAGNOSIS — R1310 Dysphagia, unspecified: Secondary | ICD-10-CM | POA: Diagnosis not present

## 2015-07-09 DIAGNOSIS — K225 Diverticulum of esophagus, acquired: Secondary | ICD-10-CM | POA: Diagnosis not present

## 2015-07-09 DIAGNOSIS — K223 Perforation of esophagus: Secondary | ICD-10-CM | POA: Diagnosis not present

## 2015-07-09 DIAGNOSIS — I7121 Aneurysm of the ascending aorta, without rupture: Secondary | ICD-10-CM

## 2015-07-09 NOTE — Progress Notes (Signed)
[address]       [place] [address]             [phone]      HPI: Mr. Grieshaber returns today to discuss the results of his CT chest  He is a 60 year old gentleman from [place]. His son is with him today. They are accompanied by a professional interpreter Mr. Thappa.  He presented in March 2016 with a contained esophageal perforation secondary to a retained foreign body. Dr. Loreta Ave was able to remove the foreign body endoscopically. He did not require surgery. He had a small contained leak that eventually resolved and he was able to resume PO intake in April 2016. Of note he was having swallowing issues prior to that event.  He has continued to have difficulty and pain with swallowing. He says that he has trouble with both solids and liquids, including water. He feels that things often stick. He has to chew his food very carefully and limited himself to soft foods. He has not lost significant weight, but has to eat "all the time." He does sometimes have pain just with swallowing his own saliva. He says he is not able to work because he has to stop and eat so frequently. He has financial concerns because of that. He relates an episode of food impaction for which he induced vomiting and noticed blood about a week ago.  A barium swallow done a few weeks ago showed a small diverticulum in the distal esophagus.  Past Medical History  Diagnosis Date  . Hypertension   . Esophageal perforation 08/21/2014    Due to retained foreign body       Current Outpatient Prescriptions  Medication Sig Dispense Refill  . pantoprazole (PROTONIX) 40 MG tablet TK 1 T PO QD 20 MINUTES BEFORE BREAKFAST  12   No current facility-administered medications for this visit.    Physical Exam BP 164/100 mmHg  Pulse 74  Resp 20  Ht  (1.651 m)  Wt 163 lb (73.936 kg)  BMI 27.12 kg/m2  SpO48 81% 60 year old man in no acute distress, anxious No cervical or supra clavicular  adenopathy Cardiac regular rate and rhythm normal S1 and S2 Lungs clear with equal breath sounds bilaterally Abdomen soft and nontender  Diagnostic Tests: CT CHEST AND ABDOMEN WITHOUT CONTRAST  TECHNIQUE: Multidetector CT imaging of the chest, abdomen and pelvis was performed following the standard protocol without IV contrast.  COMPARISON: CT, 08/03/2014.  FINDINGS: CT CHEST  Neck base and axilla: No mass or adenopathy.  Mediastinum and hila: There is thickening of the wall of the esophagus beginning at the level of the aortic arch extending to the gastroesophageal junction. Lower esophageal wall is estimated 1 cm in greatest thickness, along its right margin. There is no radiopaque foreign body. There are no adjacent inflammatory changes. The heart is normal in size. The ascending aorta is mildly dilated to 4.2 cm. No mediastinal or hilar masses or adenopathy.  Lungs and pleura: 5 mm nodule in the superior segment of the left lower lobe, image 29, series 8. 3.5 mm nodule the right middle lobe, image 40. 3 mm nodule in the right middle lobe adjacent to the minor fissure, image 36. Small calcified granuloma in the posterior left lower lobe, image 50. Lungs otherwise clear. No pleural effusion or pneumothorax.  CT ABDOMEN  Liver, spleen, gallbladder, pancreas, adrenal glands: Normal.  Kidneys, ureters, bladder: Small nonobstructing stone in the lower pole the  right kidney. No renal masses. No other stones. No hydronephrosis. Visualize ureters are normal in course and caliber.  Lymph nodes: No adenopathy.  Ascites, fluid collections: None.  Gastrointestinal: Visualized bowel and stomach are unremarkable.  MUSCULOSKELETAL  Unremarkable.  IMPRESSION: 1. Thickening of the esophagus extending from the level of the aortic arch to the distal soft this. Most notable thickening is along the right aspect of the lower esophagus above the GE junction but  measures approximately 1 cm in thickness. 2. No radiopaque foreign body is seen. No evidence of a esophageal wall hematoma. No abscess. 3. 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 Recommend annual imaging followup by CTA or MRA. This recommendation follows 2010 ACCF/AHA/AATS/ACR/ASA/SCA/SCAI/SIR/STS/SVM Guidelines for the Diagnosis and Management of Patients with Thoracic Aortic Disease. Circulation. 2010; 121: O962-X528 4. Small noncalcified lung nodules. If the patient is at high risk for bronchogenic carcinoma, follow-up chest CT at 6-12 months is recommended. If the patient is at low risk for bronchogenic carcinoma, follow-up chest CT at 12 months is recommended. This recommendation follows the consensus statement: Guidelines for Management of Small Pulmonary Nodules Detected on CT Scans: A Statement from the Fleischner Society as published in Radiology 2005;237:395-400. 5. No abnormality below diaphragm. No acute findings in the lungs.   Electronically Signed  By: Amie Portland M.D.  On: 06/27/2015 09:51 I reviewed the CT chest and concur with the findings as noted above  Impression: 60 year old man with a history of a contained esophageal perforation due to a foreign body impaction about a year ago. He had dysphagia predating that episode and has had dysphagia and odynophagia which are significant ever since.  A barium swallow showed a small diverticulum in the distal esophagus. This was a broad-based diverticulum, which in my opinion was unlikely to be the source of his symptoms. He says that the sensation of food sticking starts in his neck and he can feel it all the way down. His CT scan does not show a discrete diverticular sac would be amenable to surgical correction. His entire esophagus from the aortic arch to the GE junction is thickened and abnormal appearing. Basically the only surgical option would be an esophagectomy with gastric pull-up, or more likely a colon  interposition.  I strongly suspect there is a motility disorder at play here and think he needs esophageal manometry to further evaluate that before any intervention is recommended.  Plan: I will refer him back to Dr. Loreta Ave for esophageal manometry.  I spent 15 minutes with Mr. Miskell during this visit, greater than 50% was spent in counseling. Loreli Slot, MD [facility] [phone]

## 2017-04-27 ENCOUNTER — Encounter (HOSPITAL_COMMUNITY): Payer: Self-pay | Admitting: Emergency Medicine

## 2017-04-27 ENCOUNTER — Other Ambulatory Visit: Payer: Self-pay

## 2017-04-27 ENCOUNTER — Emergency Department (HOSPITAL_COMMUNITY): Payer: BLUE CROSS/BLUE SHIELD

## 2017-04-27 ENCOUNTER — Ambulatory Visit (INDEPENDENT_AMBULATORY_CARE_PROVIDER_SITE_OTHER)
Admission: EM | Admit: 2017-04-27 | Discharge: 2017-04-27 | Disposition: A | Discharge status: Home / Self Care | Payer: BLUE CROSS/BLUE SHIELD | Source: Home / Self Care

## 2017-04-27 DIAGNOSIS — R079 Chest pain, unspecified: Principal | ICD-10-CM | POA: Insufficient documentation

## 2017-04-27 DIAGNOSIS — Z79899 Other long term (current) drug therapy: Secondary | ICD-10-CM | POA: Diagnosis not present

## 2017-04-27 DIAGNOSIS — I160 Hypertensive urgency: Secondary | ICD-10-CM | POA: Diagnosis not present

## 2017-04-27 DIAGNOSIS — R42 Dizziness and giddiness: Secondary | ICD-10-CM | POA: Diagnosis not present

## 2017-04-27 DIAGNOSIS — I10 Essential (primary) hypertension: Secondary | ICD-10-CM | POA: Insufficient documentation

## 2017-04-27 DIAGNOSIS — I712 Thoracic aortic aneurysm, without rupture: Secondary | ICD-10-CM | POA: Insufficient documentation

## 2017-04-27 DIAGNOSIS — K219 Gastro-esophageal reflux disease without esophagitis: Secondary | ICD-10-CM | POA: Insufficient documentation

## 2017-04-27 DIAGNOSIS — I200 Unstable angina: Secondary | ICD-10-CM | POA: Diagnosis present

## 2017-04-27 DIAGNOSIS — K229 Disease of esophagus, unspecified: Secondary | ICD-10-CM | POA: Insufficient documentation

## 2017-04-27 LAB — CBC
HCT: 48.4 % (ref 39.0–52.0)
HEMOGLOBIN: 17.1 g/dL — AB (ref 13.0–17.0)
MCH: 31.5 pg (ref 26.0–34.0)
MCHC: 35.3 g/dL (ref 30.0–36.0)
MCV: 89.3 fL (ref 78.0–100.0)
PLATELETS: 141 10*3/uL — AB (ref 150–400)
RBC: 5.42 MIL/uL (ref 4.22–5.81)
RDW: 14.1 % (ref 11.5–15.5)
WBC: 8 10*3/uL (ref 4.0–10.5)

## 2017-04-27 LAB — BASIC METABOLIC PANEL
ANION GAP: 7 (ref 5–15)
BUN: 17 mg/dL (ref 6–20)
CALCIUM: 9.5 mg/dL (ref 8.9–10.3)
CO2: 24 mmol/L (ref 22–32)
CREATININE: 1.07 mg/dL (ref 0.61–1.24)
Chloride: 107 mmol/L (ref 101–111)
Glucose, Bld: 99 mg/dL (ref 65–99)
Potassium: 4 mmol/L (ref 3.5–5.1)
SODIUM: 138 mmol/L (ref 135–145)

## 2017-04-27 LAB — I-STAT TROPONIN, ED: TROPONIN I, POC: 0.01 ng/mL (ref 0.00–0.08)

## 2017-04-27 NOTE — ED Notes (Signed)
Notified Amy Grenadau PA and Langston MaskerKaren Sofia, [place] of abnormal vital signs and patient

## 2017-04-27 NOTE — ED Triage Notes (Signed)
Son/translator stated, he has had chest pain for 3 days. Sent here by [facility]. [facility]

## 2017-04-27 NOTE — Discharge Instructions (Signed)
Please go to Emergency Room to be further evaluated for dizziness, chest pain, and elevated blood pressure with headache.

## 2017-04-27 NOTE — ED Triage Notes (Signed)
Feeling bad for 3 days: dizzy, headache, chest pain.  Chest pain hurts with breathing bp check at [facility], blood pressure is elevated

## 2017-04-27 NOTE — ED Provider Notes (Signed)
[facility]    CSN: [id number] Arrival date & time: 04/27/17  1656     History   Chief Complaint Chief Complaint  Patient presents with  . Dizziness    HPI Speaks Nepali-translated by son  [name] Daugherty is a 61 y.o. male with history of HTN presenting with high blood pressure, headache, dizziness and chest pain. Dizziness is episodic and coming and going associated with chest pain that worsens with breathing. He checked his blood pressure at [facility] 1 hour ago and it was 208 systolic. These symptoms have been occurring over the past couple days off and on.   HPI  Past Medical History:  Diagnosis Date  . 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 Hypertension     Patient Active Problem List   Diagnosis Date Noted  . Esophageal perforation 08/21/2014  . Dysphagia 08/03/2014  . Food impaction of esophagus     Past Surgical History:  Procedure Laterality Date  . ESOPHAGOGASTRODUODENOSCOPY N/A 08/04/2014   Procedure: FOREIGN BODY REMOVAL ( ESOPHAGEAL MASS );  Surgeon: Charna ElizabethJyothi Mann, MD;  Location: [facility];  Service: Endoscopy;  Laterality: N/A;  . ESOPHAGOGASTRODUODENOSCOPY Left 08/03/2014   Procedure: ESOPHAGOGASTRODUODENOSCOPY (EGD);  Surgeon: Charna ElizabethJyothi Mann, MD;  [facility];  Service: Endoscopy;  Laterality: Left;       Home Medications    Prior to Admission medications   Medication Sig Start Date End Date Taking? Authorizing Provider  NON FORMULARY    Yes [provider]  pantoprazole (PROTONIX) 40 MG tablet TK 1 T PO QD 20 MINUTES BEFORE BREAKFAST 05/30/15   [provider]    Family History No family history on file.  Social History Social History   Tobacco Use  . Smoking status: Never Smoker  Substance Use Topics  . Alcohol use: Yes    Comment: social  . Drug use: No     Allergies   Patient has no known allergies.   Review of Systems Review of Systems  Constitutional: Negative for  chills and fever.  Eyes: Negative for visual disturbance.  Cardiovascular: Positive for chest pain.  Gastrointestinal: Negative for abdominal pain, nausea and vomiting.  Neurological: Positive for dizziness and headaches. Negative for syncope.     Physical Exam Triage Vital Signs ED Triage Vitals  Enc Vitals Group     BP 04/27/17 1710 (!) 190/104     Pulse Rate 04/27/17 1710 74     Resp 04/27/17 1710 (!) 22     Temp 04/27/17 1710 97.9 F (36.6 C)     Temp Source 04/27/17 1710 Oral     SpO2 04/27/17 1710 97 %     Weight --      Height --      Head Circumference --      Peak Flow --      Pain Score 04/27/17 1707 6     Pain Loc --      Pain Edu? --      Excl. in GC? --    No data found.  Updated Vital Signs BP (!) 190/104 (BP Location: Left Arm)   Pulse 74   Temp 97.9 F (36.6 C) (Oral)   Resp (!) 22   SpO2 97%    Physical Exam  Constitutional: He appears well-developed and well-nourished.  HENT:  Head: Normocephalic and atraumatic.  Eyes: Conjunctivae are normal.  Neck: Neck supple.  Cardiovascular: Normal rate and regular rhythm.  No murmur heard. Pulmonary/Chest: Effort  normal and breath sounds normal. No respiratory distress.  Abdominal: Soft. There is no tenderness.  Musculoskeletal: He exhibits no edema.  Neurological: He is alert.  Skin: Skin is warm and dry.  Psychiatric: He has a normal mood and affect.  Nursing note and vitals reviewed.    UC Treatments / Results  Labs (all labs ordered are listed, but only abnormal results are displayed) Labs Reviewed - No data to display  EKG  EKG Interpretation None     EKG obtained- Normal sinus, no evidence of acute ischemia  Radiology No results found.  Procedures Procedures (including critical care time)  Medications Ordered in UC Medications - No data to display   Initial Impression / Assessment and Plan / UC Course  I have reviewed the triage vital signs and the nursing  notes.  Pertinent labs & imaging results that were available during my care of the patient were reviewed by me and considered in my medical decision making (see chart for details).     Given his elevated blood pressure with associated symptoms of chest pain, headache and dizziness, advised him to go to ED for further workup.   Final Clinical Impressions(s) / UC Diagnoses   Final diagnoses:  Dizziness  Chest pain, unspecified type  Hypertension, unspecified type    ED Discharge Orders    None       Controlled Substance Prescriptions [place] Controlled Substance Registry consulted? Not Applicable   Lew DawesWieters, Emmanuel Gruenhagen C, [place]-C 04/27/17 1745

## 2017-04-28 ENCOUNTER — Emergency Department (HOSPITAL_COMMUNITY): Payer: BLUE CROSS/BLUE SHIELD

## 2017-04-28 ENCOUNTER — Encounter (HOSPITAL_COMMUNITY): Payer: Self-pay | Admitting: Family Medicine

## 2017-04-28 ENCOUNTER — Other Ambulatory Visit: Payer: Self-pay

## 2017-04-28 ENCOUNTER — Observation Stay (HOSPITAL_COMMUNITY)
Admission: EM | Admit: 2017-04-28 | Discharge: 2017-04-29 | Disposition: A | Discharge status: Home / Self Care | Payer: BLUE CROSS/BLUE SHIELD | Attending: Family Medicine | Admitting: Family Medicine

## 2017-04-28 DIAGNOSIS — I200 Unstable angina: Secondary | ICD-10-CM

## 2017-04-28 DIAGNOSIS — I160 Hypertensive urgency: Secondary | ICD-10-CM | POA: Diagnosis not present

## 2017-04-28 DIAGNOSIS — I712 Thoracic aortic aneurysm, without rupture: Secondary | ICD-10-CM | POA: Diagnosis not present

## 2017-04-28 DIAGNOSIS — R079 Chest pain, unspecified: Secondary | ICD-10-CM | POA: Diagnosis not present

## 2017-04-28 DIAGNOSIS — I10 Essential (primary) hypertension: Secondary | ICD-10-CM | POA: Diagnosis present

## 2017-04-28 DIAGNOSIS — K2289 Other specified disease of esophagus: Secondary | ICD-10-CM

## 2017-04-28 DIAGNOSIS — K228 Other specified diseases of esophagus: Secondary | ICD-10-CM

## 2017-04-28 DIAGNOSIS — I7121 Aneurysm of the ascending aorta, without rupture: Secondary | ICD-10-CM | POA: Diagnosis present

## 2017-04-28 DIAGNOSIS — R0789 Other chest pain: Secondary | ICD-10-CM | POA: Diagnosis present

## 2017-04-28 LAB — TROPONIN I: Troponin I: <0.03 ng/mL (ref <0.03)

## 2017-04-28 LAB — LIPASE, BLOOD: Lipase: 45 U/L (ref 11–51)

## 2017-04-28 LAB — HEPATIC FUNCTION PANEL
ALT: 29 U/L (ref 17–63)
AST: 36 U/L (ref 15–41)
Albumin: 3.7 g/dL (ref 3.5–5.0)
Alkaline Phosphatase: 51 U/L (ref 38–126)
BILIRUBIN DIRECT: 0.2 mg/dL (ref 0.1–0.5)
BILIRUBIN TOTAL: 1.1 mg/dL (ref 0.3–1.2)
Indirect Bilirubin: 0.9 mg/dL (ref 0.3–0.9)
Total Protein: 7.7 g/dL (ref 6.5–8.1)

## 2017-04-28 MED ORDER — HYDRALAZINE HCL 20 MG/ML IJ SOLN: 10.0000 mg | INTRAMUSCULAR | Status: DC | PRN

## 2017-04-28 MED ORDER — METOPROLOL TARTRATE 12.5 MG HALF TABLET
12.5000 mg | ORAL_TABLET | Freq: Two times a day (BID) | ORAL | Status: DC
  Administered 2017-04-28 – 2017-04-29 (×3): 12.5 mg via ORAL
  Filled 2017-04-28 (×3): qty 1

## 2017-04-28 MED ORDER — ONDANSETRON HCL 4 MG/2ML IJ SOLN
4.0000 mg | Freq: Four times a day (QID) | INTRAMUSCULAR | Status: DC | PRN
  Administered 2017-04-29: 4 mg via INTRAVENOUS
  Filled 2017-04-28: qty 2

## 2017-04-28 MED ORDER — SODIUM CHLORIDE 0.9 % IV SOLN: 250.0000 mL | INTRAVENOUS | Status: DC | PRN

## 2017-04-28 MED ORDER — ASPIRIN EC 81 MG PO TBEC
81.0000 mg | DELAYED_RELEASE_TABLET | Freq: Every day | ORAL | Status: DC
  Administered 2017-04-29: 81 mg via ORAL
  Filled 2017-04-28: qty 1

## 2017-04-28 MED ORDER — GI COCKTAIL ~~LOC~~: 30.0000 mL | Freq: Three times a day (TID) | ORAL | Status: DC | PRN

## 2017-04-28 MED ORDER — ZOLPIDEM TARTRATE 5 MG PO TABS: 5.0000 mg | ORAL_TABLET | Freq: Every evening | ORAL | Status: DC | PRN

## 2017-04-28 MED ORDER — ALPRAZOLAM 0.25 MG PO TABS
0.2500 mg | ORAL_TABLET | Freq: Two times a day (BID) | ORAL | Status: DC | PRN
  Administered 2017-04-29: 0.25 mg via ORAL
  Filled 2017-04-28: qty 1

## 2017-04-28 MED ORDER — ASPIRIN 81 MG PO CHEW
324.0000 mg | CHEWABLE_TABLET | Freq: Once | ORAL | Status: AC
  Administered 2017-04-28: 324 mg via ORAL
  Filled 2017-04-28: qty 4

## 2017-04-28 MED ORDER — IOPAMIDOL (ISOVUE-370) INJECTION 76%
INTRAVENOUS | Status: AC
  Administered 2017-04-28: 100 mL
  Filled 2017-04-28: qty 100

## 2017-04-28 MED ORDER — NITROGLYCERIN 0.4 MG SL SUBL: 0.4000 mg | SUBLINGUAL_TABLET | SUBLINGUAL | Status: DC | PRN

## 2017-04-28 MED ORDER — NITROGLYCERIN 0.4 MG SL SUBL
0.4000 mg | SUBLINGUAL_TABLET | SUBLINGUAL | Status: DC | PRN
  Administered 2017-04-28: 0.4 mg via SUBLINGUAL
  Filled 2017-04-28: qty 1

## 2017-04-28 MED ORDER — SODIUM CHLORIDE 0.9% FLUSH: 3.0000 mL | Freq: Two times a day (BID) | INTRAVENOUS | Status: DC

## 2017-04-28 MED ORDER — ENSURE ENLIVE PO LIQD
237.0000 mL | Freq: Two times a day (BID) | ORAL | Status: DC
  Administered 2017-04-29: 237 mL via ORAL

## 2017-04-28 MED ORDER — FAMOTIDINE IN NACL 20-0.9 MG/50ML-% IV SOLN
20.0000 mg | Freq: Two times a day (BID) | INTRAVENOUS | Status: DC
  Administered 2017-04-28 – 2017-04-29 (×3): 20 mg via INTRAVENOUS
  Filled 2017-04-28 (×3): qty 50

## 2017-04-28 MED ORDER — MORPHINE SULFATE (PF) 4 MG/ML IV SOLN: 1.0000 mg | INTRAVENOUS | Status: DC | PRN

## 2017-04-28 MED ORDER — INFLUENZA VAC SPLIT QUAD 0.5 ML IM SUSY
0.5000 mL | PREFILLED_SYRINGE | INTRAMUSCULAR | Status: DC
  Filled 2017-04-28: qty 0.5

## 2017-04-28 MED ORDER — ACETAMINOPHEN 325 MG PO TABS
650.0000 mg | ORAL_TABLET | ORAL | Status: DC | PRN
  Administered 2017-04-29: 650 mg via ORAL
  Filled 2017-04-28: qty 2

## 2017-04-28 MED ORDER — ENOXAPARIN SODIUM 40 MG/0.4ML ~~LOC~~ SOLN
40.0000 mg | SUBCUTANEOUS | Status: DC
  Administered 2017-04-28 – 2017-04-29 (×2): 40 mg via SUBCUTANEOUS
  Filled 2017-04-28 (×2): qty 0.4

## 2017-04-28 MED ORDER — METOPROLOL TARTRATE 5 MG/5ML IV SOLN
5.0000 mg | Freq: Once | INTRAVENOUS | Status: AC
  Administered 2017-04-28: 5 mg via INTRAVENOUS
  Filled 2017-04-28: qty 5

## 2017-04-28 MED ORDER — SODIUM CHLORIDE 0.9% FLUSH: 3.0000 mL | INTRAVENOUS | Status: DC | PRN

## 2017-04-28 NOTE — ED Notes (Signed)
ED Provider at bedside. 

## 2017-04-28 NOTE — ED Notes (Signed)
Breakfast Tray Ordered. 

## 2017-04-28 NOTE — ED Provider Notes (Signed)
[facility] Provider Note   CSN: [id number] Arrival date & time: 04/27/17  1748     History   Chief Complaint Chief Complaint  Patient presents with  . Chest Pain  . Hypertension    HPI [name] Daugherty is a 61 y.o. male.  The history is provided by the patient and a relative. A language interpreter was used (nepali - [phone]).  Chest Pain   This is a new problem. Episode onset: Unknown. The problem occurs constantly. The problem has been gradually worsening. The pain is moderate. The pain does not radiate. Associated symptoms include dizziness. Pertinent negatives include no fever.  His past medical history is significant for hypertension.  Hypertension  Associated symptoms include chest pain.  Patient with history of hypertension, esophageal perforation, aortic dilatation presents with chest pain, hypertension and dizziness Per patient, has been having the chest pain for "some days " He also noted that his blood pressure was elevated today, and he is been feeling dizzy He also reports of difficulty swallowing, but this appears to be a chronic issue Was seen at the urgent care and sent here for further evaluation  Past Medical History:  Diagnosis Date  . 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 Hypertension     Patient Active Problem List   Diagnosis Date Noted  . Esophageal perforation 08/21/2014  . Dysphagia 08/03/2014  . Food impaction of esophagus     Past Surgical History:  Procedure Laterality Date  . ESOPHAGOGASTRODUODENOSCOPY N/A 08/04/2014   Procedure: FOREIGN BODY REMOVAL ( ESOPHAGEAL MASS );  Surgeon: Charna ElizabethJyothi Mann, MD;  Location: [facility];  Service: Endoscopy;  Laterality: N/A;  . ESOPHAGOGASTRODUODENOSCOPY Left 08/03/2014   Procedure: ESOPHAGOGASTRODUODENOSCOPY (EGD);  Surgeon: Charna ElizabethJyothi Mann, MD;  [facility];  Service: Endoscopy;  Laterality: Left;       Home Medications    Prior to  Admission medications   Medication Sig Start Date End Date Taking? Authorizing Provider  amLODipine (NORVASC) 5 MG tablet Take 5 mg by mouth daily.   Yes [provider]  metoCLOPramide (REGLAN) 5 MG tablet Take 5 mg by mouth 3 (three) times daily before meals.   Yes [provider]  pantoprazole (PROTONIX) 40 MG tablet TAKE 40MG  BY MOUTH DAILY AS NEEDED FOR ACID REFLUX 20 MINUTES BEFORE BREAKFAST 05/30/15  Yes [provider]    Family History No family history on file.  Social History Social History   Tobacco Use  . Smoking status: Current Some Day Smoker  . Smokeless tobacco: Current User  Substance Use Topics  . Alcohol use: Yes    Comment: social  . Drug use: No     Allergies   Patient has no known allergies.   Review of Systems Review of Systems  Constitutional: Negative for fever.  Cardiovascular: Positive for chest pain.  Neurological: Positive for dizziness.  All other systems reviewed and are negative.    Physical Exam Updated Vital Signs BP (!) 160/91   Pulse 73   Temp 98.8 F (37.1 C) (Oral)   Resp 16   Ht 1.651 m (5\' 5" )   Wt 80.7 kg (178 lb)   SpO2 98%   BMI 29.62 kg/m   Physical Exam CONSTITUTIONAL: Elderly HEAD: Normocephalic/atraumatic EYES: EOMI/PERRL ENMT: Mucous membranes moist NECK: supple no meningeal signs SPINE/BACK:entire spine nontender CV: S1/S2 noted, no murmurs/rubs/gallops noted LUNGS: Lungs are clear to auscultation bilaterally, no apparent distress ABDOMEN: soft, diffuse mild tenderness,  no rebound or guarding, bowel sounds noted throughout abdomen GU:no cva tenderness NEURO: Pt is awake/alert/appropriate, moves all extremitiesx4.  No facial droop.   EXTREMITIES: pulses normal/equalx4, full ROM SKIN: warm, color normal PSYCH: no abnormalities of mood noted, alert and oriented to situation   ED Treatments / Results  Labs (all labs ordered are listed, but only abnormal results are  displayed) Labs Reviewed  CBC - Abnormal; Notable for the following components:      Result Value   Hemoglobin 17.1 (*)    Platelets 141 (*)    All other components within normal limits  BASIC METABOLIC PANEL  LIPASE, BLOOD  HEPATIC FUNCTION PANEL  I-STAT TROPONIN, ED    EKG  EKG Interpretation  Date/Time:  Wednesday April 28 2017 01:28:16 EST Ventricular Rate:  72 PR Interval:    QRS Duration: 104 QT Interval:  384 QTC Calculation: 421 R Axis:   77 Text Interpretation:  Sinus rhythm Consider left atrial enlargement Probable left ventricular hypertrophy No significant change since last tracing Confirmed by Zadie Rhine ([id number]) on 04/28/2017 1:32:06 AM       Radiology Dg Chest 2 View  Result Date: 04/27/2017 CLINICAL DATA:  Chest pain and shortness of breath EXAM: CHEST  2 VIEW COMPARISON:  Chest CT June 27, 2015 and chest radiograph August 21, 2014 FINDINGS: There is no edema or consolidation. Heart size and pulmonary vascularity are normal. No adenopathy. No appreciable bone lesions. There is moderate fat in the anterior epicardial region, stable. IMPRESSION: No edema or consolidation. Electronically Signed   By: Bretta Bang III M.D.   On: 04/27/2017 19:02   Ct Angio Chest/abd/pel For Dissection W And/or Wo Contrast  Result Date: 04/28/2017 CLINICAL DATA:  Chest pain for 3 days. Acute aortic syndrome suspected. EXAM: CT ANGIOGRAPHY CHEST, ABDOMEN AND PELVIS TECHNIQUE: Multidetector CT imaging through the chest, abdomen and pelvis was performed using the standard protocol during bolus administration of intravenous contrast. Multiplanar reconstructed images and MIPs were obtained and reviewed to evaluate the vascular anatomy. CONTRAST:  ISOVUE-370 IOPAMIDOL (ISOVUE-370) INJECTION 76% COMPARISON:  Noncontrast CT of the chest abdomen and pelvis 06/27/2015 FINDINGS: CTA CHEST FINDINGS Cardiovascular: Mild fusiform aneurysmal dilatation of the ascending aorta,  4.0 cm, previously 4.2 cm. No dissection, aortic hematoma, or evidence of acute aortic syndrome. Mild atherosclerotic calcifications. Conventional branching pattern from the aortic arch. Borderline cardiomegaly. No filling defects within the pulmonary arteries to suggest pulmonary embolus. No pericardial effusion. Minimal contrast refluxing into the hepatic veins and IVC. Mediastinum/Nodes: Mild esophageal wall thickening, less prominent than on prior exam. No enlarged mediastinal or hilar lymph nodes. Visualized thyroid gland is normal. Lungs/Pleura: No consolidation, pulmonary edema or pleural fluid. Mild motion artifact through the basis. Subpleural nodule in the superior segment of the left lower lobe is unchanged from prior exam image 56 series 7. Tiny pleural-based right middle lobe nodule image 82 is unchanged. Tiny right middle lobe perifissural nodule image 74 is unchanged. The left lower lobe calcified granuloma is also unchanged, best appreciated on noncontrast axial images. Musculoskeletal: There are no acute or suspicious osseous abnormalities. Review of the MIP images confirms the above findings. CTA ABDOMEN AND PELVIS FINDINGS VASCULAR Aorta: Normal caliber aorta without aneurysm, dissection, vasculitis or significant stenosis. Celiac: Patent without evidence of aneurysm, dissection, vasculitis or significant stenosis. SMA: Patent without evidence of aneurysm, dissection, vasculitis or significant stenosis. Renals: Both renal arteries are patent without evidence of aneurysm, dissection, vasculitis, fibromuscular dysplasia or significant stenosis. IMA: Patent without  evidence of aneurysm, dissection, vasculitis or significant stenosis. Inflow: Patent without evidence of aneurysm, dissection, vasculitis or significant stenosis. Tortuosity bilaterally. Veins: No obvious venous abnormality within the limitations of this arterial phase study. Review of the MIP images confirms the above findings.  NON-VASCULAR Hepatobiliary: No focal hepatic lesion on arterial phase imaging. Gallbladder physiologically distended, no calcified stone. No biliary dilatation. Pancreas: No ductal dilatation or inflammation. Spleen: Normal in size and arterial phase imaging. Adrenals/Urinary Tract: Normal adrenal glands. No hydronephrosis or perinephric edema. Nonobstructing right renal stone on prior CT is not visualized. The urinary bladder is nondistended. Stomach/Bowel: No bowel inflammation, wall thickening or obstruction. Sigmoid colonic tortuosity. Small volume of colonic stool. Appendix not confidently visualized, no evidence appendicitis. Lymphatic: No enlarged abdominal or pelvic lymph nodes. Reproductive: Prostate is unremarkable. Other: No free air or ascites. Musculoskeletal: There are no acute or suspicious osseous abnormalities. Review of the MIP images confirms the above findings. IMPRESSION: 1. No acute aortic abnormality. Mild fusiform aneurysmal dilatation of the ascending aorta, maximal dimension 4.0, previously 4.2 cm. Recommend annual imaging followup by CTA or MRA. This recommendation follows 2010 ACCF/AHA/AATS/ACR/ASA/SCA/SCAI/SIR/STS/SVM Guidelines for the Diagnosis and Management of Patients with Thoracic Aortic Disease. Circulation. 2010; 121: [id number] 2. Contrast refluxing into the hepatic veins and IVC can be seen with elevated right heart pressures. 3. Mild persistent esophageal wall thickening, improved from prior exam. 4. Unchanged small pulmonary nodules, all measuring less than 4 mm. 5. No acute abnormality in the abdomen or pelvis. Electronically Signed   By: Rubye OaksMelanie  Ehinger M.D.   On: 04/28/2017 03:22    Procedures Procedures (including critical care time)  Medications Ordered in ED Medications  nitroGLYCERIN (NITROSTAT) SL tablet 0.4 mg (0.4 mg Sublingual Given 04/28/17 0230)  iopamidol (ISOVUE-370) 76 % injection (100 mLs  Contrast Given 04/28/17 0246)  aspirin chewable tablet 324  mg (324 mg Oral Given 04/28/17 0358)     Initial Impression / Assessment and Plan / ED Course  I have reviewed the triage vital signs and the nursing notes.  Pertinent labs & imaging results that were available during my care of the patient were reviewed by me and considered in my medical decision making (see chart for details).     3:22 AM Patient in the ER with chest pain, dizziness and high blood pressure He is in no distress, but due to elevation of blood pressure and chest pain and previous CTs reveal dilation of his aorta will proceed with CT dissection protocol He will then need to be admitted to the hospital 4:27 AM dissection protocol is negative for acute aortic emergency No signs of any acute abdominal catastrophe on CT His chest pain improved with oral nitroglycerin Not clear if he is actually taking any blood pressure medicines at this time, because his son showed me the medications he takes for blood pressure and he showed me Reglan I informed son that that is actually for nausea not blood pressure  However due to history of chest pain, dizziness hypertension I feel he needs to be admitted for further testing to rule out ACS  Discussed with Dr. Antionette Charpyd for admission Final Clinical Impressions(s) / ED Diagnoses   Final diagnoses:  Unstable angina [facility](HCC)    ED Discharge Orders    None       Zadie RhineWickline, Mehak Roskelley, MD 04/28/17 [phone]

## 2017-04-28 NOTE — ED Notes (Signed)
Patient transported to CT 

## 2017-04-28 NOTE — H&P (Addendum)
History and Physical    Foster Simpsonek Stiggers ZOX:[id number] DOB: 11/19/1955 DOA: 04/28/2017  PCP: Patient, No Pcp Per   Patient coming from: Home  Chief Complaint: Chest pain   HPI: Foster Simpsonek Jefcoat is a 61 y.o. 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  Patient is accompanied by his son who assists with the history.  He reports that for the past 3 days, he has been experiencing intermittent chest pain, localized to the central chest, associated with mild lightheadedness, resolving spontaneously over the course of an hour or so, and with no alleviating or exacerbating factors identified.  There has not been any associated dyspnea, nausea, or sweats.  He has not experienced these symptoms previously.  He denied attempted any interventions prior to coming in.  ED Course: Upon arrival to the ED, patient is found to be afebrile, saturating well on room air, hypertensive to 190/110, and with vitals otherwise stable.  EKG features normal sinus rhythm and chest x-ray is negative for edema or consolidation.  Chemistry panel and CBC are unremarkable, and troponin is within normal limits.  CTA chest/abdomen/pelvis was obtained and negative for acute aortic abnormality or acute abdominal or pelvic abnormality, but notable for chronic findings such as mild fusiform aneurysmal dilation of the ascending aorta maximal dimension of 4.0 cm.  Patient was treated with 324 mg of aspirin in the ED, given nitroglycerin with resolution in his chest pain but development of headache.  He remains hypertensive, but stable and in no acute distress.  He will be admitted to the telemetry unit for ongoing evaluation of chest pain, not relieved by nitroglycerin but otherwise fairly atypical and possibly secondary to a GI etiology.  Review of Systems:  All other systems reviewed and apart from HPI, are  negative.  Past Medical History:  Diagnosis Date  . 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 Hypertension     Past Surgical History:  Procedure Laterality Date  . ESOPHAGOGASTRODUODENOSCOPY N/A 08/04/2014   Procedure: FOREIGN BODY REMOVAL ( ESOPHAGEAL MASS );  Surgeon: Charna ElizabethJyothi Mann, MD;  Location: [facility] OR;  Service: Endoscopy;  Laterality: N/A;  . ESOPHAGOGASTRODUODENOSCOPY Left 08/03/2014   Procedure: ESOPHAGOGASTRODUODENOSCOPY (EGD);  Surgeon: Charna ElizabethJyothi Mann, MD;  Location[facility] END[facility]COPY;  Service: Endoscopy;  Laterality: Left;     reports that he has been smoking.  He uses smokeless tobacco. He reports that he drinks alcohol. He reports that he does not use drugs.  No Known Allergies  Family History  Problem Relation Age of Onset  . Sudden Cardiac Death Neg Hx      Prior to Admission medications   Medication Sig Start Date End Date Taking? Authorizing Provider  amLODipine (NORVASC) 5 MG tablet Take 5 mg by mouth daily.   Yes [provider]  metoCLOPramide (REGLAN) 5 MG tablet Take 5 mg by mouth 3 (three) times daily before meals.   Yes [provider]  pantoprazole (PROTONIX) 40 MG tablet TAKE 40MG  BY MOUTH DAILY AS NEEDED FOR ACID REFLUX 20 MINUTES BEFORE BREAKFAST 05/30/15  Yes [provider]    Physical Exam: Vitals:   04/28/17 0430 04/28/17 0445 04/28/17 0530 04/28/17 0545  BP: (!) 165/105 (!) 158/101 (!) 169/99 (!) 157/105  Pulse: 63 61 61 61  Resp: 13 14 14 14   Temp:      TempSrc:      SpO2: 97%  97% 98% 97%  Weight:      Height:          Constitutional: NAD, calm, appears uncomfortable Eyes: PERTLA, lids and conjunctivae normal ENMT: Mucous membranes are moist. Posterior pharynx clear of any exudate or lesions.   Neck: normal, supple, no masses, no thyromegaly Respiratory: clear to auscultation bilaterally, no wheezing, no crackles. Normal respiratory effort.    Cardiovascular: S1 & S2 heard, regular rate and  rhythm. No significant JVD. Abdomen: No distension, no tenderness, no masses palpated. Bowel sounds normal.  Musculoskeletal: no clubbing / cyanosis. No joint deformity upper and lower extremities.  Skin: no significant rashes, lesions, ulcers. Warm, dry, well-perfused. Neurologic: CN 2-12 grossly intact. Sensation intact. Strength 5/5 in all 4 limbs.  Psychiatric: Alert and oriented x 3. Pleasant and cooperative.     Labs on Admission: I have personally reviewed following labs and imaging studies  CBC: Recent Labs  Lab 04/27/17 1806  WBC 8.0  HGB 17.1*  HCT 48.4  MCV 89.3  PLT 141*   Basic Metabolic Panel: Recent Labs  Lab 04/27/17 1806  NA 138  K 4.0  CL 107  CO2 24  GLUCOSE 99  BUN 17  CREATININE 1.07  CALCIUM 9.5   GFR: Estimated Creatinine Clearance: 71.9 mL/min (by C-G formula based on SCr of 1.07 mg/dL). Liver Function Tests: Recent Labs  Lab 04/27/17 1806  AST 36  ALT 29  ALKPHOS 51  BILITOT 1.1  PROT 7.7  ALBUMIN 3.7   Recent Labs  Lab 04/27/17 1806  LIPASE 45   No results for input(s): AMMONIA in the last 168 hours. Coagulation Profile: No results for input(s): INR, PROTIME in the last 168 hours. Cardiac Enzymes: No results for input(s): CKTOTAL, CKMB, CKMBINDEX, TROPONINI in the last 168 hours. BNP (last 3 results) No results for input(s): PROBNP in the last 8760 hours. HbA1C: No results for input(s): HGBA1C in the last 72 hours. CBG: No results for input(s): GLUCAP in the last 168 hours. Lipid Profile: No results for input(s): CHOL, HDL, LDLCALC, TRIG, CHOLHDL, LDLDIRECT in the last 72 hours. Thyroid Function Tests: No results for input(s): TSH, T4TOTAL, FREET4, T3FREE, THYROIDAB in the last 72 hours. Anemia Panel: No results for input(s): VITAMINB12, FOLATE, FERRITIN, TIBC, IRON, RETICCTPCT in the last 72 hours. Urine analysis: No results found for: COLORURINE, APPEARANCEUR, LABSPEC, PHURINE, GLUCOSEU, HGBUR, BILIRUBINUR, KETONESUR,  PROTEINUR, UROBILINOGEN, NITRITE, LEUKOCYTESUR Sepsis Labs: @LABRCNTIP (procalcitonin:4,lacticidven:4) )No results found for this or any previous visit (from the past 240 hour(s)).   Radiological Exams on Admission: Dg Chest 2 View  Result Date: 04/27/2017 CLINICAL DATA:  Chest pain and shortness of breath EXAM: CHEST  2 VIEW COMPARISON:  Chest CT June 27, 2015 and chest radiograph August 21, 2014 FINDINGS: There is no edema or consolidation. Heart size and pulmonary vascularity are normal. No adenopathy. No appreciable bone lesions. There is moderate fat in the anterior epicardial region, stable. IMPRESSION: No edema or consolidation. Electronically Signed   By: Bretta Bang III M.D.   On: 04/27/2017 19:02   Ct Angio Chest/abd/pel For Dissection W And/or Wo Contrast  Result Date: 04/28/2017 CLINICAL DATA:  Chest pain for 3 days. Acute aortic syndrome suspected. EXAM: CT ANGIOGRAPHY CHEST, ABDOMEN AND PELVIS TECHNIQUE: Multidetector CT imaging through the chest, abdomen and pelvis was performed using the standard protocol during bolus administration of intravenous contrast. Multiplanar reconstructed images and MIPs were obtained and reviewed to evaluate the vascular anatomy. CONTRAST:  ISOVUE-370 IOPAMIDOL (ISOVUE-370) INJECTION 76% COMPARISON:  Noncontrast CT of the chest abdomen and pelvis 06/27/2015 FINDINGS: CTA CHEST FINDINGS Cardiovascular: Mild fusiform aneurysmal dilatation of the ascending aorta, 4.0 cm, previously 4.2 cm. No dissection, aortic hematoma, or evidence of acute aortic syndrome. Mild atherosclerotic calcifications. Conventional branching pattern from the aortic arch. Borderline cardiomegaly. No filling defects within the pulmonary arteries to suggest pulmonary embolus. No pericardial effusion. Minimal contrast refluxing into the hepatic veins and IVC. Mediastinum/Nodes: Mild esophageal wall thickening, less prominent than on prior exam. No enlarged mediastinal or  hilar lymph nodes. Visualized thyroid gland is normal. Lungs/Pleura: No consolidation, pulmonary edema or pleural fluid. Mild motion artifact through the basis. Subpleural nodule in the superior segment of the left lower lobe is unchanged from prior exam image 56 series 7. Tiny pleural-based right middle lobe nodule image 82 is unchanged. Tiny right middle lobe perifissural nodule image 74 is unchanged. The left lower lobe calcified granuloma is also unchanged, best appreciated on noncontrast axial images. Musculoskeletal: There are no acute or suspicious osseous abnormalities. Review of the MIP images confirms the above findings. CTA ABDOMEN AND PELVIS FINDINGS VASCULAR Aorta: Normal caliber aorta without aneurysm, dissection, vasculitis or significant stenosis. Celiac: Patent without evidence of aneurysm, dissection, vasculitis or significant stenosis. SMA: Patent without evidence of aneurysm, dissection, vasculitis or significant stenosis. Renals: Both renal arteries are patent without evidence of aneurysm, dissection, vasculitis, fibromuscular dysplasia or significant stenosis. IMA: Patent without evidence of aneurysm, dissection, vasculitis or significant stenosis. Inflow: Patent without evidence of aneurysm, dissection, vasculitis or significant stenosis. Tortuosity bilaterally. Veins: No obvious venous abnormality within the limitations of this arterial phase study. Review of the MIP images confirms the above findings. NON-VASCULAR Hepatobiliary: No focal hepatic lesion on arterial phase imaging. Gallbladder physiologically distended, no calcified stone. No biliary dilatation. Pancreas: No ductal dilatation or inflammation. Spleen: Normal in size and arterial phase imaging. Adrenals/Urinary Tract: Normal adrenal glands. No hydronephrosis or perinephric edema. Nonobstructing right renal stone on prior CT is not visualized. The urinary bladder is nondistended. Stomach/Bowel: No bowel inflammation, wall  thickening or obstruction. Sigmoid colonic tortuosity. Small volume of colonic stool. Appendix not confidently visualized, no evidence appendicitis. Lymphatic: No enlarged abdominal or pelvic lymph nodes. Reproductive: Prostate is unremarkable. Other: No free air or ascites. Musculoskeletal: There are no acute or suspicious osseous abnormalities. Review of the MIP images confirms the above findings. IMPRESSION: 1. No acute aortic abnormality. Mild fusiform aneurysmal dilatation of the ascending aorta, maximal dimension 4.0, previously 4.2 cm. Recommend annual imaging followup by CTA or MRA. This recommendation follows 2010 ACCF/AHA/AATS/ACR/ASA/SCA/SCAI/SIR/STS/SVM Guidelines for the Diagnosis and Management of Patients with Thoracic Aortic Disease. Circulation. 2010; 121: [id number] 2. Contrast refluxing into the hepatic veins and IVC can be seen with elevated right heart pressures. 3. Mild persistent esophageal wall thickening, improved from prior exam. 4. Unchanged small pulmonary nodules, all measuring less than 4 mm. 5. No acute abnormality in the abdomen or pelvis. Electronically Signed   By: Rubye OaksMelanie  Ehinger M.D.   On: 04/28/2017 03:22    EKG: Independently reviewed. Normal sinus rhythm.   Assessment/Plan  1. Chest pain  - Pt presents with 3 days of intermittent chest pain  - CXR and EKG are unremarkable, troponin negative, and CTA chest/abd/pelvis negative for acute process  - He was treated with ASA 324 mg and Lopressor  - Pain resolved with NTG in ED  - Continue cardiac monitoring, obtain serial troponin measurements, repeat EKG, continue beta-blocker as tolerated, start Pepcid and prn GI cocktail for possible GI-etiology  2. Hypertension with hypertensive urgency  - BP elevated to 190/110 range in ED  - Improved briefly with Lopressor IVP, will start oral Lopressor as HR allows, use prn hydralazine IVP's  - Prescribed Norvasc, but not filled in several months   3. Esophageal thickening    - Esophageal thickening noted on CT, though has improved from prior  - Possibly a sequelae of prior foreign body with perforation  - ?GI-source for his atypical chest pain, Pepcid and prn GI cocktail started    4. Ascending aortic aneurysm  - Noted on CTA, 4.0 cm in maximal dimensions, down from 4.2 on prior study  - Continue outpatient follow-up    DVT prophylaxis: Lovenox  Code Status: Full  Family Communication: Son updated at bedside Disposition Plan: Admit to telemetry Consults called: None Admission status: Inpatient    Briscoe Deutscher, MD [facility] Pager [phone]  If 7PM-7AM, please contact night-coverage [email or web address] Password [facility]  04/28/2017, 6:17 AM

## 2017-04-28 NOTE — ED Notes (Signed)
Heart healthy lunch tray ordered at 1031

## 2017-04-28 NOTE — ED Notes (Signed)
Admitting MD at bedside.

## 2017-04-28 NOTE — Progress Notes (Signed)
Patient seen and evaluated earlier this a.m. by my associate. Agree with workup. Patient is currently chest pain-free. Awaiting final troponin evaluation. Continue to monitor on telemetry. Plan will be to discharge with negative troponins with plans for patient to follow up with his primary care physician for further evaluation and recommendations.  [name] Daugherty  Gen: Pt in nad, alert nad awake CV: s1 and s2 wnl, no rubs Pulm: no increased wob, no wheezes

## 2017-04-29 DIAGNOSIS — R079 Chest pain, unspecified: Secondary | ICD-10-CM | POA: Diagnosis not present

## 2017-04-29 LAB — HIV ANTIBODY (ROUTINE TESTING W REFLEX): HIV Screen 4th Generation wRfx: NONREACTIVE

## 2017-04-29 MED ORDER — METOPROLOL TARTRATE 25 MG PO TABS: 12.5000 mg | ORAL_TABLET | Freq: Two times a day (BID) | ORAL | 0 refills | Status: DC

## 2017-04-29 MED ORDER — METOPROLOL TARTRATE 25 MG PO TABS: 25.0000 mg | ORAL_TABLET | Freq: Two times a day (BID) | ORAL | Status: DC

## 2017-04-29 NOTE — Progress Notes (Signed)
Used interpretor [phone] to give pt and his son d/c instructions. IV removed, clean and intact. Tele removed. Paper prescription given. Instructions on when to return to hospital given. Son to drive pt home.  Versie StarksHanna  Latressa Harries, RN

## 2017-04-29 NOTE — Progress Notes (Signed)
Used interpreter [phone] to assess patient and pass morning medications.

## 2017-04-29 NOTE — Progress Notes (Signed)
Nutrition Brief Note  Patient identified on the Malnutrition Screening Tool (MST) Report.  Wt Readings from Last 15 Encounters:  04/29/17 156 lb 12.8 oz (71.1 kg)  07/09/15 163 lb (73.9 kg)  06/18/15 160 lb (72.6 kg)  08/21/14 155 lb 8 oz (70.5 kg)  08/14/14 148 lb 6.4 oz (67.3 kg)  08/04/14 143 lb 4.8 oz (65 kg)   Body mass index is 26.09 kg/m. Patient meets criteria for Overweight based on current BMI.   Current diet order is Heart Healthy, patient is consuming approximately 100% of meals at this time. Labs and medications reviewed.   No nutrition interventions warranted at this time. If nutrition issues arise, please consult RD.   Maureen ChattersKatie Neng Albee, RD, LDN Pager #: [phone] After-Hours Pager #: [phone]

## 2017-04-29 NOTE — Discharge Summary (Signed)
Physician Discharge Summary  [name] Daugherty ZOX:[id number] DOB: 05/30/1955 DOA: 04/28/2017  PCP: Patient, No Pcp Per  Admit date: 04/28/2017 Discharge date: 04/29/2017  Time spent: > 35 minutes  Recommendations for Outpatient Follow-up:  Pt had CT scan which reported the following : No acute aortic abnormality. Mild fusiform aneurysmal dilatation of the ascending aorta, maximal dimension 4.0, previously 4.2 cm. Recommend annual imaging followup by CTA or MRA   Discharge Diagnoses:  Principal Problem:   Chest pain Active Problems:   Hypertension   Hypertensive urgency   Esophageal thickening   Ascending aortic aneurysm [facility](HCC)   Discharge Condition: stable  Diet recommendation: heart healthy  Filed Weights   04/27/17 1757 04/29/17 0505  Weight: 80.7 kg (178 lb) 71.1 kg (156 lb 12.8 oz)    History of present illness:  61 y.o. 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  Patient is accompanied by his son who assists with the history    Hospital Course:  Chest pain - negative troponin x 3 - no chest pain on day of d/c - sinus rhythm with no st elevation or depression  Essential hypertension - d/c on amlodipine and metoprolol  Procedures:  none  Consultations:  none  Discharge Exam: Vitals:   04/29/17 0505 04/29/17 0921  BP: (!) 142/85 (!) 173/96  Pulse: 70 72  Resp: (!) 25   Temp: 97.6 F (36.4 C)   SpO2: 98%     General: Pt in nad, alert and awake Cardiovascular: rrr, no rubs Respiratory: no increased wob, no wheezes  Discharge Instructions   Discharge Instructions    Call MD for:  severe uncontrolled pain   Complete by:  As directed    Call MD for:  temperature >100.4   Complete by:  As directed    Diet - low sodium heart healthy   Complete by:  As directed    Discharge instructions   Complete by:  As  directed    Please follow up with your primary care physician for further evaluation and recommendations   Increase activity slowly   Complete by:  As directed      Allergies as of 04/29/2017   No Known Allergies     Medication List    TAKE these medications   amLODipine 5 MG tablet Commonly known as:  NORVASC Take 5 mg by mouth daily.   metoCLOPramide 5 MG tablet Commonly known as:  REGLAN Take 5 mg by mouth 3 (three) times daily before meals.   metoprolol tartrate 25 MG tablet Commonly known as:  LOPRESSOR Take 0.5 tablets (12.5 mg total) by mouth 2 (two) times daily.   pantoprazole 40 MG tablet Commonly known as:  PROTONIX TAKE 40MG  BY MOUTH DAILY AS NEEDED FOR ACID REFLUX 20 MINUTES BEFORE BREAKFAST      No Known Allergies    The results of significant diagnostics from this hospitalization (including imaging, microbiology, ancillary and laboratory) are listed below for reference.    Significant Diagnostic Studies: Dg Chest 2 View  Result Date: 04/27/2017 CLINICAL DATA:  Chest pain and shortness of breath EXAM: CHEST  2 VIEW COMPARISON:  Chest CT June 27, 2015 and chest radiograph August 21, 2014 FINDINGS: There is no edema or consolidation. Heart size and pulmonary vascularity are normal. No adenopathy. No appreciable bone lesions. There is moderate fat in the anterior epicardial region, stable. IMPRESSION: No edema or consolidation.  Electronically Signed   By: Bretta Bang III M.D.   On: 04/27/2017 19:02   Ct Angio Chest/abd/pel For Dissection W And/or Wo Contrast  Result Date: 04/28/2017 CLINICAL DATA:  Chest pain for 3 days. Acute aortic syndrome suspected. EXAM: CT ANGIOGRAPHY CHEST, ABDOMEN AND PELVIS TECHNIQUE: Multidetector CT imaging through the chest, abdomen and pelvis was performed using the standard protocol during bolus administration of intravenous contrast. Multiplanar reconstructed images and MIPs were obtained and reviewed to evaluate the  vascular anatomy. CONTRAST:  ISOVUE-370 IOPAMIDOL (ISOVUE-370) INJECTION 76% COMPARISON:  Noncontrast CT of the chest abdomen and pelvis 06/27/2015 FINDINGS: CTA CHEST FINDINGS Cardiovascular: Mild fusiform aneurysmal dilatation of the ascending aorta, 4.0 cm, previously 4.2 cm. No dissection, aortic hematoma, or evidence of acute aortic syndrome. Mild atherosclerotic calcifications. Conventional branching pattern from the aortic arch. Borderline cardiomegaly. No filling defects within the pulmonary arteries to suggest pulmonary embolus. No pericardial effusion. Minimal contrast refluxing into the hepatic veins and IVC. Mediastinum/Nodes: Mild esophageal wall thickening, less prominent than on prior exam. No enlarged mediastinal or hilar lymph nodes. Visualized thyroid gland is normal. Lungs/Pleura: No consolidation, pulmonary edema or pleural fluid. Mild motion artifact through the basis. Subpleural nodule in the superior segment of the left lower lobe is unchanged from prior exam image 56 series 7. Tiny pleural-based right middle lobe nodule image 82 is unchanged. Tiny right middle lobe perifissural nodule image 74 is unchanged. The left lower lobe calcified granuloma is also unchanged, best appreciated on noncontrast axial images. Musculoskeletal: There are no acute or suspicious osseous abnormalities. Review of the MIP images confirms the above findings. CTA ABDOMEN AND PELVIS FINDINGS VASCULAR Aorta: Normal caliber aorta without aneurysm, dissection, vasculitis or significant stenosis. Celiac: Patent without evidence of aneurysm, dissection, vasculitis or significant stenosis. SMA: Patent without evidence of aneurysm, dissection, vasculitis or significant stenosis. Renals: Both renal arteries are patent without evidence of aneurysm, dissection, vasculitis, fibromuscular dysplasia or significant stenosis. IMA: Patent without evidence of aneurysm, dissection, vasculitis or significant stenosis. Inflow:  Patent without evidence of aneurysm, dissection, vasculitis or significant stenosis. Tortuosity bilaterally. Veins: No obvious venous abnormality within the limitations of this arterial phase study. Review of the MIP images confirms the above findings. NON-VASCULAR Hepatobiliary: No focal hepatic lesion on arterial phase imaging. Gallbladder physiologically distended, no calcified stone. No biliary dilatation. Pancreas: No ductal dilatation or inflammation. Spleen: Normal in size and arterial phase imaging. Adrenals/Urinary Tract: Normal adrenal glands. No hydronephrosis or perinephric edema. Nonobstructing right renal stone on prior CT is not visualized. The urinary bladder is nondistended. Stomach/Bowel: No bowel inflammation, wall thickening or obstruction. Sigmoid colonic tortuosity. Small volume of colonic stool. Appendix not confidently visualized, no evidence appendicitis. Lymphatic: No enlarged abdominal or pelvic lymph nodes. Reproductive: Prostate is unremarkable. Other: No free air or ascites. Musculoskeletal: There are no acute or suspicious osseous abnormalities. Review of the MIP images confirms the above findings. IMPRESSION: 1. No acute aortic abnormality. Mild fusiform aneurysmal dilatation of the ascending aorta, maximal dimension 4.0, previously 4.2 cm. Recommend annual imaging followup by CTA or MRA. This recommendation follows 2010 ACCF/AHA/AATS/ACR/ASA/SCA/SCAI/SIR/STS/SVM Guidelines for the Diagnosis and Management of Patients with Thoracic Aortic Disease. Circulation. 2010; 121: [id number] 2. Contrast refluxing into the hepatic veins and IVC can be seen with elevated right heart pressures. 3. Mild persistent esophageal wall thickening, improved from prior exam. 4. Unchanged small pulmonary nodules, all measuring less than 4 mm. 5. No acute abnormality in the abdomen or pelvis. Electronically Signed   By: Shawna Orleans  Ehinger M.D.   On: 04/28/2017 03:22    Microbiology: No results found for  this or any previous visit (from the past 240 hour(s)).   Labs: Basic Metabolic Panel: Recent Labs  Lab 04/27/17 1806  NA 138  K 4.0  CL 107  CO2 24  GLUCOSE 99  BUN 17  CREATININE 1.07  CALCIUM 9.5   Liver Function Tests: Recent Labs  Lab 04/27/17 1806  AST 36  ALT 29  ALKPHOS 51  BILITOT 1.1  PROT 7.7  ALBUMIN 3.7   Recent Labs  Lab 04/27/17 1806  LIPASE 45   No results for input(s): AMMONIA in the last 168 hours. CBC: Recent Labs  Lab 04/27/17 1806  WBC 8.0  HGB 17.1*  HCT 48.4  MCV 89.3  PLT 141*   Cardiac Enzymes: Recent Labs  Lab 04/28/17 0508 04/28/17 1041 04/28/17 1616  TROPONINI <0.03 <0.03 <0.03   BNP: BNP (last 3 results) No results for input(s): BNP in the last 8760 hours.  ProBNP (last 3 results) No results for input(s): PROBNP in the last 8760 hours.  CBG: No results for input(s): GLUCAP in the last 168 hours.   Signed:  Penny PiaVEGA, Matas Burrows MD.  [facility] 04/29/2017, 12:10 PM

## 2018-09-11 ENCOUNTER — Emergency Department (HOSPITAL_COMMUNITY)
Admission: EM | Admit: 2018-09-11 | Discharge: 2018-09-11 | Disposition: A | Discharge status: Home / Self Care | Payer: BLUE CROSS/BLUE SHIELD | Attending: Emergency Medicine | Admitting: Emergency Medicine

## 2018-09-11 ENCOUNTER — Other Ambulatory Visit: Payer: Self-pay

## 2018-09-11 ENCOUNTER — Emergency Department (HOSPITAL_COMMUNITY): Payer: BLUE CROSS/BLUE SHIELD

## 2018-09-11 DIAGNOSIS — Z79899 Other long term (current) drug therapy: Secondary | ICD-10-CM | POA: Insufficient documentation

## 2018-09-11 DIAGNOSIS — F17210 Nicotine dependence, cigarettes, uncomplicated: Secondary | ICD-10-CM | POA: Insufficient documentation

## 2018-09-11 DIAGNOSIS — R509 Fever, unspecified: Secondary | ICD-10-CM | POA: Diagnosis present

## 2018-09-11 DIAGNOSIS — B349 Viral infection, unspecified: Secondary | ICD-10-CM

## 2018-09-11 DIAGNOSIS — I10 Essential (primary) hypertension: Secondary | ICD-10-CM | POA: Insufficient documentation

## 2018-09-11 LAB — BASIC METABOLIC PANEL
Anion gap: 12 (ref 5–15)
BUN: 12 mg/dL (ref 8–23)
CO2: 22 mmol/L (ref 22–32)
Calcium: 8.8 mg/dL — ABNORMAL LOW (ref 8.9–10.3)
Chloride: 101 mmol/L (ref 98–111)
Creatinine, Ser: 1.18 mg/dL (ref 0.61–1.24)
GFR calc Af Amer: >60 mL/min (ref >60)
GFR calc non Af Amer: >60 mL/min (ref >60)
Glucose, Bld: 89 mg/dL (ref 70–99)
Potassium: 3.6 mmol/L (ref 3.5–5.1)
Sodium: 135 mmol/L (ref 135–145)

## 2018-09-11 LAB — CBC WITH DIFFERENTIAL/PLATELET
Abs Immature Granulocytes: 0.02 10*3/uL (ref 0.00–0.07)
Basophils Absolute: 0 10*3/uL (ref 0.0–0.1)
Basophils Relative: 0 %
Eosinophils Absolute: 0 10*3/uL (ref 0.0–0.5)
Eosinophils Relative: 0 %
HCT: 46 % (ref 39.0–52.0)
Hemoglobin: 15.9 g/dL (ref 13.0–17.0)
Immature Granulocytes: 0 %
Lymphocytes Relative: 27 %
Lymphs Abs: 1.7 10*3/uL (ref 0.7–4.0)
MCH: 30.7 pg (ref 26.0–34.0)
MCHC: 34.6 g/dL (ref 30.0–36.0)
MCV: 88.8 fL (ref 80.0–100.0)
Monocytes Absolute: 1.2 10*3/uL — ABNORMAL HIGH (ref 0.1–1.0)
Monocytes Relative: 20 %
Neutro Abs: 3.2 10*3/uL (ref 1.7–7.7)
Neutrophils Relative %: 53 %
Platelets: DECREASED 10*3/uL (ref 150–400)
RBC: 5.18 MIL/uL (ref 4.22–5.81)
RDW: 13.8 % (ref 11.5–15.5)
WBC: 6.1 10*3/uL (ref 4.0–10.5)
nRBC: 0 % (ref 0.0–0.2)

## 2018-09-11 MED ORDER — AMLODIPINE BESYLATE 5 MG PO TABS
5.0000 mg | ORAL_TABLET | Freq: Once | ORAL | Status: AC
Start: 2018-09-11 — End: 2018-09-11
  Administered 2018-09-11: 5 mg via ORAL
  Filled 2018-09-11: qty 1

## 2018-09-11 MED ORDER — AMLODIPINE BESYLATE 5 MG PO TABS: 5.0000 mg | ORAL_TABLET | Freq: Every day | ORAL | 1 refills | Status: DC

## 2018-09-11 NOTE — Discharge Instructions (Signed)
Person Under Monitoring Name: [name] Daugherty  Location: [address] [place] [address]   Infection Prevention Recommendations for Individuals Confirmed to have, or Being Evaluated for, 2019 Novel Coronavirus (COVID-19) Infection Who Receive Care at Home  Individuals who are confirmed to have, or are being evaluated for, COVID-19 should follow the prevention steps below until a healthcare provider or local or state health department says they can return to normal activities.  Stay home except to get medical care You should restrict activities outside your home, except for getting medical care. Do not go to work, school, or public areas, and do not use public transportation or taxis.  Call ahead before visiting your doctor Before your medical appointment, call the healthcare provider and tell them that you have, or are being evaluated for, COVID-19 infection. This will help the healthcare providers office take steps to keep other people from getting infected. Ask your healthcare provider to call the local or state health department.  Monitor your symptoms Seek prompt medical attention if your illness is worsening (e.g., difficulty breathing). Before going to your medical appointment, call the healthcare provider and tell them that you have, or are being evaluated for, COVID-19 infection. Ask your healthcare provider to call the local or state health department.  Wear a facemask You should wear a facemask that covers your nose and mouth when you are in the same room with other people and when you visit a healthcare provider. People who live with or visit you should also wear a facemask while they are in the same room with you.  Separate yourself from other people in your home As much as possible, you should stay in a different room from other people in your home. Also, you should use a separate bathroom, if available.  Avoid sharing household items You should not share  dishes, drinking glasses, cups, eating utensils, towels, bedding, or other items with other people in your home. After using these items, you should wash them thoroughly with soap and water.  Cover your coughs and sneezes Cover your mouth and nose with a tissue when you cough or sneeze, or you can cough or sneeze into your sleeve. Throw used tissues in a lined trash can, and immediately wash your hands with soap and water for at least 20 seconds or use an alcohol-based hand rub.  Wash your [employer] your hands often and thoroughly with soap and water for at least 20 seconds. You can use an alcohol-based hand sanitizer if soap and water are not available and if your hands are not visibly dirty. Avoid touching your eyes, nose, and mouth with unwashed hands.   Prevention Steps for Caregivers and Household Members of Individuals Confirmed to have, or Being Evaluated for, COVID-19 Infection Being Cared for in the Home  If you live with, or provide care at home for, a person confirmed to have, or being evaluated for, COVID-19 infection please follow these guidelines to prevent infection:  Follow healthcare providers instructions Make sure that you understand and can help the patient follow any healthcare provider instructions for all care.  Provide for the patients basic needs You should help the patient with basic needs in the home and provide support for getting groceries, prescriptions, and other personal needs.  Monitor the patients symptoms If they are getting sicker, call his or her medical provider and tell them that the patient has, or is being evaluated for, COVID-19 infection. This will help the healthcare providers office  take steps to keep other people from getting infected. °Ask the healthcare provider to call the local or state health department. ° °Limit the number of people who have contact with the patient °If possible, have only one caregiver for the patient. °Other  household members should stay in another home or place of residence. If this is not possible, they should stay °in another room, or be separated from the patient as much as possible. Use a separate bathroom, if available. °Restrict visitors who do not have an essential need to be in the home. ° °Keep older adults, very young children, and other sick people away from the patient °Keep older adults, very young children, and those who have compromised immune systems or chronic health conditions away from the patient. This includes people with chronic heart, lung, or kidney conditions, diabetes, and cancer. ° °Ensure good ventilation °Make sure that shared spaces in the home have good air flow, such as from an air conditioner or an opened window, °weather permitting. ° °Wash your hands often °Wash your hands often and thoroughly with soap and water for at least 20 seconds. You can use an alcohol based hand sanitizer if soap and water are not available and if your hands are not visibly dirty. °Avoid touching your eyes, nose, and mouth with unwashed hands. °Use disposable paper towels to dry your hands. If not available, use dedicated cloth towels and replace them when they become wet. ° °Wear a facemask and gloves °Wear a disposable facemask at all times in the room and gloves when you touch or have contact with the patient’s blood, body fluids, and/or secretions or excretions, such as sweat, saliva, sputum, nasal mucus, vomit, urine, or feces.  Ensure the mask fits over your nose and mouth tightly, and do not touch it during use. °Throw out disposable facemasks and gloves after using them. Do not reuse. °Wash your hands immediately after removing your facemask and gloves. °If your personal clothing becomes contaminated, carefully remove clothing and launder. Wash your hands after handling contaminated clothing. °Place all used disposable facemasks, gloves, and other waste in a lined container before disposing them with  other household waste. °Remove gloves and wash your hands immediately after handling these items. ° °Do not share dishes, glasses, or other household items with the patient °Avoid sharing household items. You should not share dishes, drinking glasses, cups, eating utensils, towels, bedding, or other items with a patient who is confirmed to have, or being evaluated for, COVID-19 infection. °After the person uses these items, you should wash them thoroughly with soap and water. ° °Wash laundry thoroughly °Immediately remove and wash clothes or bedding that have blood, body fluids, and/or secretions or excretions, such as sweat, saliva, sputum, nasal mucus, vomit, urine, or feces, on them. °Wear gloves when handling laundry from the patient. °Read and follow directions on labels of laundry or clothing items and detergent. In general, wash and dry with the warmest temperatures recommended on the label. ° °Clean all areas the individual has used often °Clean all touchable surfaces, such as counters, tabletops, doorknobs, bathroom fixtures, toilets, phones, keyboards, tablets, and bedside tables, every day. Also, clean any surfaces that may have blood, body fluids, and/or secretions or excretions on them. °Wear gloves when cleaning surfaces the patient has come in contact with. °Use a diluted bleach solution (e.g., dilute bleach with 1 part bleach and 10 parts water) or a household disinfectant with a label that says EPA-registered for coronaviruses. To make a bleach   solution at home, add 1 tablespoon of bleach to 1 quart (4 cups) of water. For a larger supply, add  cup of bleach to 1 gallon (16 cups) of water. Read labels of cleaning products and follow recommendations provided on product labels. Labels contain instructions for safe and effective use of the cleaning product including precautions you should take when applying the product, such as wearing gloves or eye protection and making sure you have good ventilation  during use of the product. Remove gloves and wash hands immediately after cleaning.  Monitor yourself for signs and symptoms of illness Caregivers and household members are considered close contacts, should monitor their health, and will be asked to limit movement outside of the home to the extent possible. Follow the monitoring steps for close contacts listed on the symptom monitoring form.   ? If you have additional questions, contact your local health department or call the epidemiologist on call at [phone] (available 24/7). ? This guidance is subject to change. For the most up-to-date guidance from [facility], please refer to their website: [email or web address]

## 2018-09-11 NOTE — ED Triage Notes (Signed)
Pt reports that yesterday he mowed his yard and since has had a fever, cough, body aches.

## 2018-09-11 NOTE — ED Provider Notes (Signed)
[facility] EMERGENCY DEPARTMENT Provider Note   CSN: [id number] Arrival date & time: 09/11/18  1440    History   Chief Complaint No chief complaint on file.   HPI [name] Daugherty is a 63 y.o. male.     HPI Well-appearing hemodynamically stable 63 year old man with a past medical history of hypertension presents to the emergency department for 3 days of intermittent fevers and cough.  Denies any shortness of breath or chest pain.  Believes that this started after he was working outside in his garden.  He works at a [employer] and does not know if he has had any coronavirus exposures.  He ran out of his high blood pressure medication several days ago and has not been able to take any.  Denies any changes in bowel or bladder habits and appetite has been normal.  Denies pain at this time apart from occasional pain when coughing in his throat.  No recent travel.  No other significant past medical history. Past Medical History:  Diagnosis Date  . 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 Hypertension     Patient Active Problem List   Diagnosis Date Noted  . Chest pain 04/28/2017  . Hypertension 04/28/2017  . Hypertensive urgency 04/28/2017  . Esophageal thickening 04/28/2017  . Ascending aortic aneurysm (HCC) 04/28/2017  . Esophageal perforation 08/21/2014  . Dysphagia 08/03/2014  . Food impaction of esophagus     Past Surgical History:  Procedure Laterality Date  . ESOPHAGOGASTRODUODENOSCOPY N/A 08/04/2014   Procedure: FOREIGN BODY REMOVAL ( ESOPHAGEAL MASS );  Surgeon: Charna Elizabeth, MD;  Location: [facility] OR;  Service: Endoscopy;  Laterality: N/A;  . ESOPHAGOGASTRODUODENOSCOPY Left 08/03/2014   Procedure: ESOPHAGOGASTRODUODENOSCOPY (EGD);  Surgeon: Charna Elizabeth, MD;  Location: [facility] ENDOSCOPY;  Service: Endoscopy;  Laterality: Left;        Home Medications    Prior to Admission medications   Medication Sig Start Date End Date  Taking? Authorizing Provider  amLODipine (NORVASC) 5 MG tablet Take 5 mg by mouth daily.    [provider]  metoCLOPramide (REGLAN) 5 MG tablet Take 5 mg by mouth 3 (three) times daily before meals.    [provider]  metoprolol tartrate (LOPRESSOR) 25 MG tablet Take 0.5 tablets (12.5 mg total) by mouth 2 (two) times daily. 04/29/17   Penny Pia, MD  pantoprazole (PROTONIX) 40 MG tablet TAKE 40MG  BY MOUTH DAILY AS NEEDED FOR ACID REFLUX 20 MINUTES BEFORE BREAKFAST 05/30/15   [provider]    Family History Family History  Problem Relation Age of Onset  . Sudden Cardiac Death Neg Hx     Social History Social History   Tobacco Use  . Smoking status: Current Some Day Smoker  . Smokeless tobacco: Current User  Substance Use Topics  . Alcohol use: Yes    Comment: social  . Drug use: No     Allergies   Patient has no known allergies.   Review of Systems Review of Systems  Constitutional: Positive for fever. Negative for chills.  HENT: Positive for congestion. Negative for ear pain and sore throat.   Eyes: Negative for pain and visual disturbance.  Respiratory: Positive for cough. Negative for shortness of breath.   Cardiovascular: Negative for chest pain and palpitations.  Gastrointestinal: Negative for abdominal pain and vomiting.  Endocrine: Negative for polyphagia and polyuria.  Genitourinary: Negative for dysuria and hematuria.  Musculoskeletal: Negative for arthralgias and back pain.  Skin:  Negative for color change and rash.  Allergic/Immunologic: Negative for immunocompromised state.  Neurological: Negative for seizures and syncope.  All other systems reviewed and are negative.    Physical Exam Updated Vital Signs Pulse 97   Temp (!) 100.6 F (38.1 C)   Physical Exam Vitals signs and nursing note reviewed.  Constitutional:      Appearance: He is well-developed.  HENT:     Head: Normocephalic and atraumatic.     Nose:  Rhinorrhea present.     Mouth/Throat:     Pharynx: No oropharyngeal exudate or posterior oropharyngeal erythema.  Eyes:     Conjunctiva/sclera: Conjunctivae normal.  Neck:     Musculoskeletal: Neck supple.  Cardiovascular:     Rate and Rhythm: Normal rate and regular rhythm.     Heart sounds: No murmur.  Pulmonary:     Effort: Pulmonary effort is normal. No respiratory distress.     Breath sounds: Normal breath sounds.  Abdominal:     Palpations: Abdomen is soft.     Tenderness: There is no abdominal tenderness.  Musculoskeletal:        General: No deformity or signs of injury.     Right lower leg: No edema.     Left lower leg: No edema.  Skin:    General: Skin is warm and dry.  Neurological:     General: No focal deficit present.     Mental Status: He is alert and oriented to person, place, and time. Mental status is at baseline.     Cranial Nerves: No cranial nerve deficit.     Sensory: No sensory deficit.     Motor: No weakness.      ED Treatments / Results  Labs (all labs ordered are listed, but only abnormal results are displayed) Labs Reviewed - No data to display  EKG None  Radiology No results found.  Procedures Procedures (including critical care time)  Medications Ordered in ED Medications - No data to display   Initial Impression / Assessment and Plan / ED Course  I have reviewed the triage vital signs and the nursing notes.  Pertinent labs & imaging results that were available during my care of the patient were reviewed by me and considered in my medical decision making (see chart for details).        Well-appearing hemodynamically stable 63 year old man presents to the emergency department with signs and symptoms consistent with URI.  Chest x-ray does not show any infiltration and lung sounds are clear so I do not believe this represents a lower respiratory tract infection.  Able to ambulate in the department without complication.  No  significant past medical history apart from hypertension.  We will restart his home amlodipine as he ran out several days ago.  Given a home dose here.  Basic lab work unremarkable.  Patient in agreement with plan for discharge.  COVID isolation precautions given to the patient.  We will not test at this time.Work note given.  Final Clinical Impressions(s) / ED Diagnoses   Final diagnoses:  Viral illness    ED Discharge Orders         Ordered    amLODipine (NORVASC) 5 MG tablet  Daily     09/11/18 1620           Ina KickWestphal, Obrian Bulson, MD 09/11/18 1709    Gerhard MunchLockwood, Robert, MD 09/13/18 [phone]

## 2018-09-11 NOTE — ED Notes (Signed)
Patient verbalizes understanding of discharge instructions. Opportunity for questioning and answers were provided. Armband removed by staff, pt discharged from ED.  

## 2019-01-26 IMAGING — CT CT ANGIO CHEST-ABD-PELV FOR DISSECTION W/ AND WO/W CM
2 of 7 series · 12 of 46 positions shown, 14 images · IV contrast (APPLIED)
Comparison: Noncontrast CT of the chest abdomen and pelvis
06/27/2015

CLINICAL DATA: Chest pain for 3 days. Acute aortic syndrome
suspected.

EXAM:
CT ANGIOGRAPHY CHEST, ABDOMEN AND PELVIS
TECHNIQUE: Multidetector CT imaging through the chest, abdomen and pelvis was
performed using the standard protocol during bolus administration of
intravenous contrast. Multiplanar reconstructed images and MIPs were
obtained and reviewed to evaluate the vascular anatomy.
CONTRAST:  100mL ZD7HC6-9V1 IOPAMIDOL (ZD7HC6-9V1) INJECTION 76%

[Series 6: arterial · axial · arterial · 0.69mm/px · z∈[+766,+1254]mm · 9 of 306 slices shown, 11 images]
[im 31/306  soft-tissue]
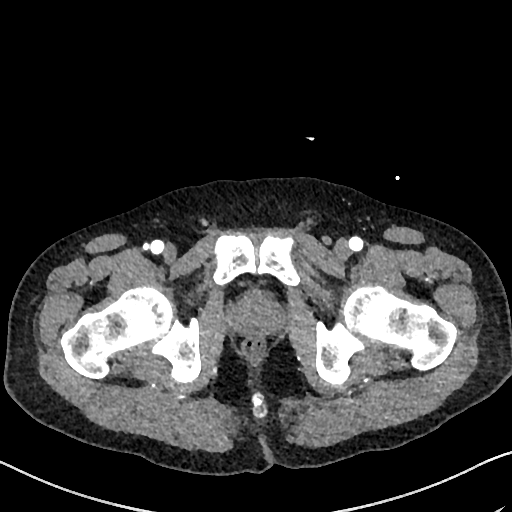
[im 31/306  bone]
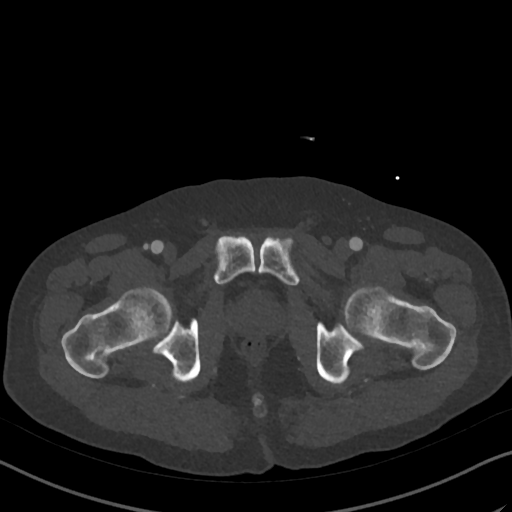
[im 62/306  soft-tissue]
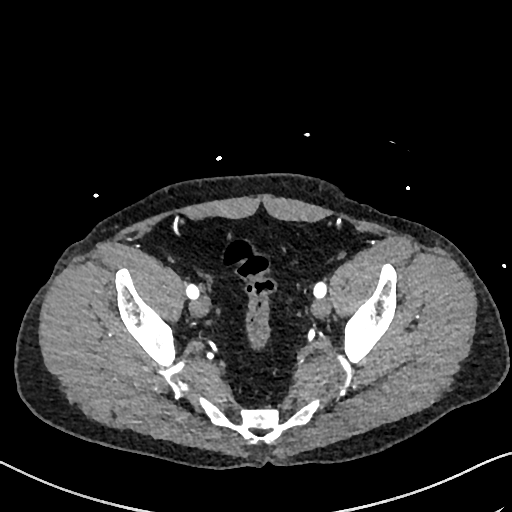
[im 92/306  soft-tissue]
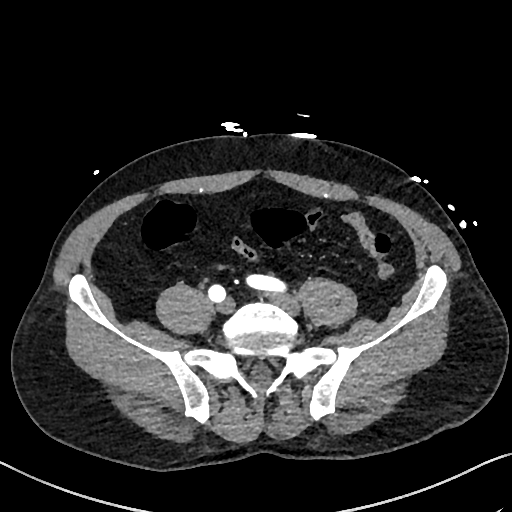
[im 123/306  soft-tissue]
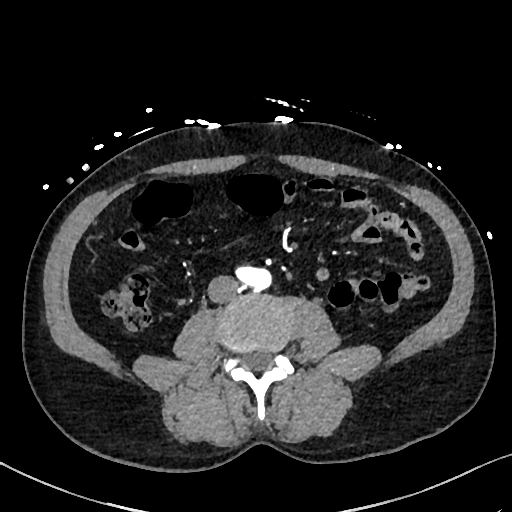
[im 153/306  soft-tissue]
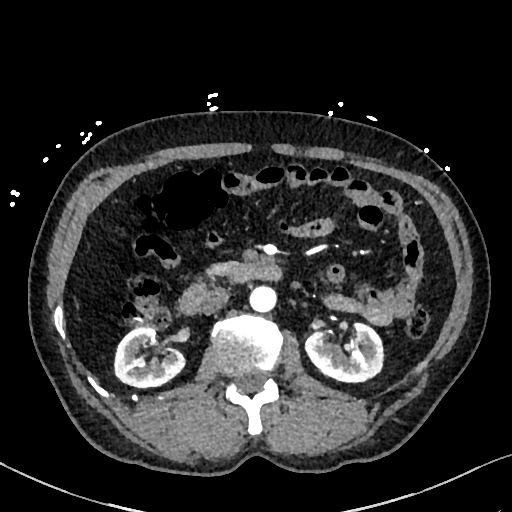
[im 184/306  soft-tissue]
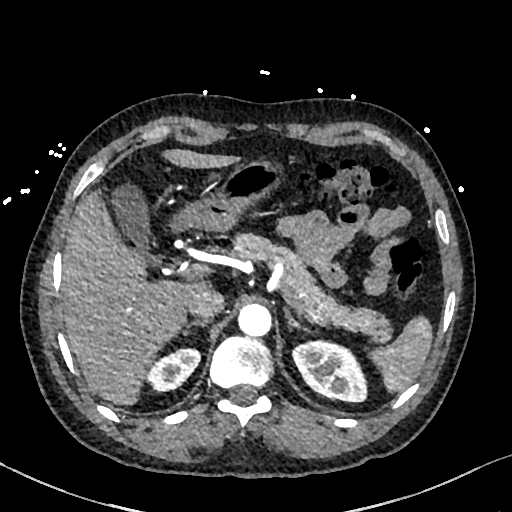
[im 214/306  soft-tissue]
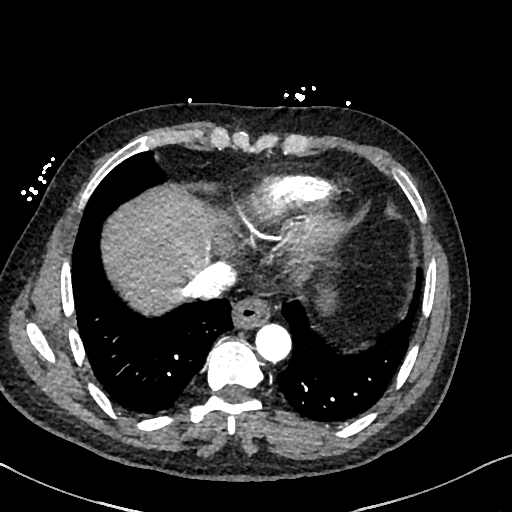
[im 245/306  soft-tissue]
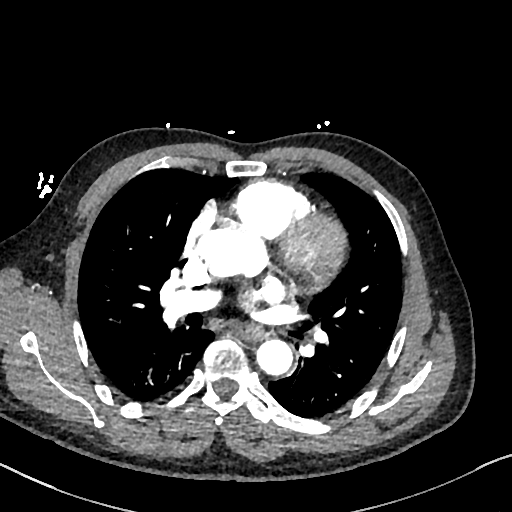
[im 275/306  soft-tissue]
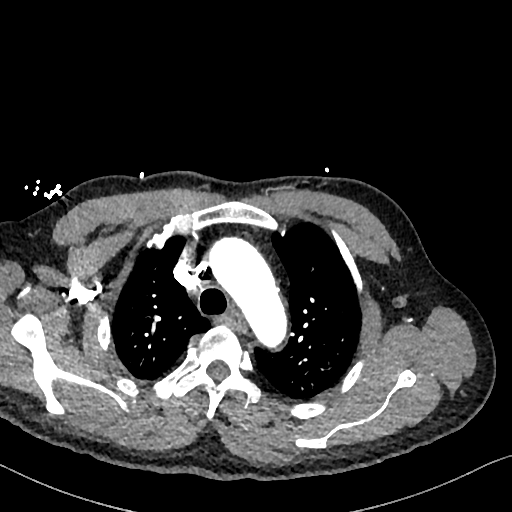
[im 275/306  bone]
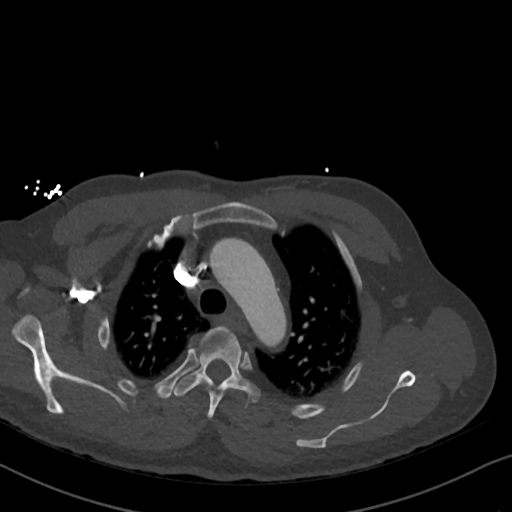

[Series 9: cor · coronal · 0.66mm/px · 3 of 125 slices shown]
[im 32/125  soft-tissue]
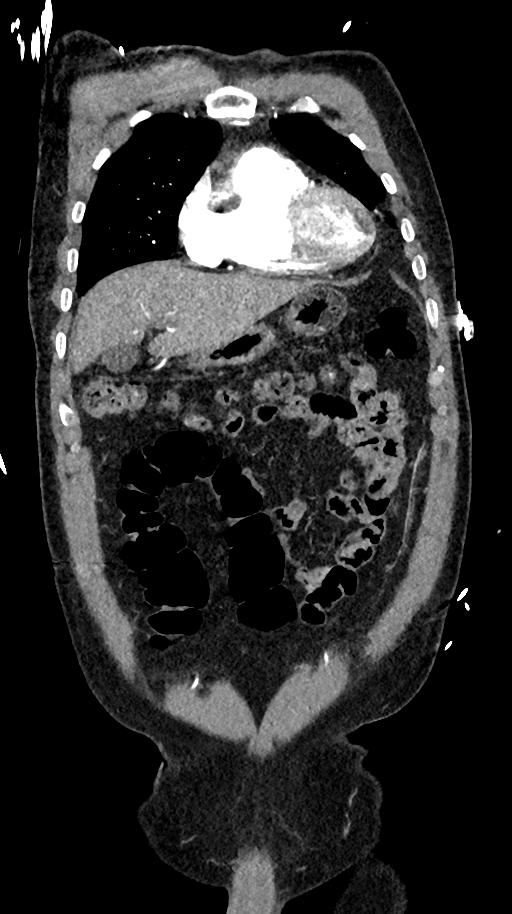
[im 63/125  soft-tissue]
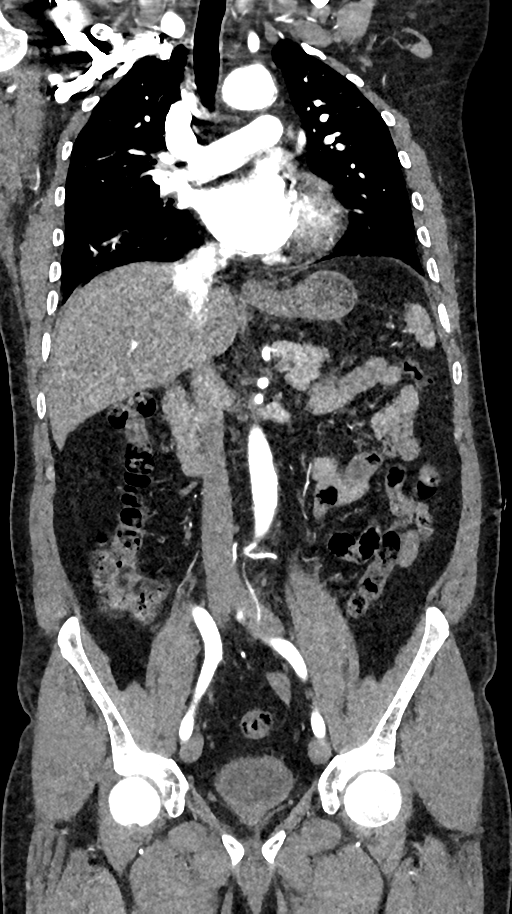
[im 94/125  soft-tissue]
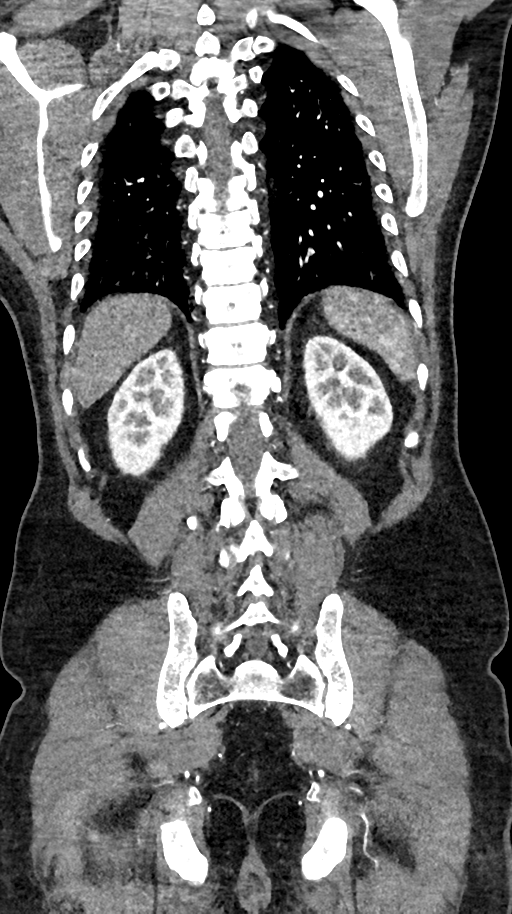

[12 of 46 positions shown; findings below may reference images not displayed]

FINDINGS: CTA CHEST FINDINGS

Cardiovascular: Mild fusiform aneurysmal dilatation of the ascending
aorta, 4.0 cm, previously 4.2 cm. No dissection, aortic hematoma, or
evidence of acute aortic syndrome. Mild atherosclerotic
calcifications. Conventional branching pattern from the aortic arch.
Borderline cardiomegaly. No filling defects within the pulmonary
arteries to suggest pulmonary embolus. No pericardial effusion.
Minimal contrast refluxing into the hepatic veins and IVC.

Mediastinum/Nodes: Mild esophageal wall thickening, less prominent
than on prior exam. No enlarged mediastinal or hilar lymph nodes.
Visualized thyroid gland is normal.

Lungs/Pleura: No consolidation, pulmonary edema or pleural fluid.
Mild motion artifact through the basis. Subpleural nodule in the
superior segment of the left lower lobe is unchanged from prior exam
image 56 series 7. Tiny pleural-based right middle lobe nodule image
82 is unchanged. Tiny right middle lobe perifissural nodule image 74
is unchanged. The left lower lobe calcified granuloma is also
unchanged, best appreciated on noncontrast axial images.

Musculoskeletal: There are no acute or suspicious osseous
abnormalities.

Review of the MIP images confirms the above findings.

CTA ABDOMEN AND PELVIS FINDINGS

VASCULAR

Aorta: Normal caliber aorta without aneurysm, dissection, vasculitis
or significant stenosis.

Celiac: Patent without evidence of aneurysm, dissection, vasculitis
or significant stenosis.

SMA: Patent without evidence of aneurysm, dissection, vasculitis or
significant stenosis.

Renals: Both renal arteries are patent without evidence of aneurysm,
dissection, vasculitis, fibromuscular dysplasia or significant
stenosis.

IMA: Patent without evidence of aneurysm, dissection, vasculitis or
significant stenosis.

Inflow: Patent without evidence of aneurysm, dissection, vasculitis
or significant stenosis. Tortuosity bilaterally.

Veins: No obvious venous abnormality within the limitations of this
arterial phase study.

Review of the MIP images confirms the above findings.

NON-VASCULAR

Hepatobiliary: No focal hepatic lesion on arterial phase imaging.
Gallbladder physiologically distended, no calcified stone. No
biliary dilatation.

Pancreas: No ductal dilatation or inflammation.

Spleen: Normal in size and arterial phase imaging.

Adrenals/Urinary Tract: Normal adrenal glands. No hydronephrosis or
perinephric edema. Nonobstructing right renal stone on prior CT is
not visualized. The urinary bladder is nondistended.

Stomach/Bowel: No bowel inflammation, wall thickening or
obstruction. Sigmoid colonic tortuosity. Small volume of colonic
stool. Appendix not confidently visualized, no evidence
appendicitis.

Lymphatic: No enlarged abdominal or pelvic lymph nodes.

Reproductive: Prostate is unremarkable.

Other: No free air or ascites.

Musculoskeletal: There are no acute or suspicious osseous
abnormalities.

Review of the MIP images confirms the above findings.
IMPRESSION: 1. No acute aortic abnormality. Mild fusiform aneurysmal dilatation
of the ascending aorta, maximal dimension 4.0, previously 4.2 cm.
Recommend annual imaging followup by CTA or MRA. This recommendation
follows 2272 ACCF/AHA/AATS/ACR/ASA/SCA/MD IKRAME/JEANLEON/KAK/AYUSO Guidelines
for the Diagnosis and Management of Patients with Thoracic Aortic
Disease. Circulation. 2272; 121: [id number]
2. Contrast refluxing into the hepatic veins and IVC can be seen
with elevated right heart pressures.
3. Mild persistent esophageal wall thickening, improved from prior
exam.
4. Unchanged small pulmonary nodules, all measuring less than 4 mm.
5. No acute abnormality in the abdomen or pelvis.

## 2019-06-26 ENCOUNTER — Ambulatory Visit: Payer: BLUE CROSS/BLUE SHIELD | Attending: Internal Medicine

## 2019-06-26 DIAGNOSIS — Z20822 Contact with and (suspected) exposure to covid-19: Secondary | ICD-10-CM

## 2019-06-27 LAB — NOVEL CORONAVIRUS, NAA: SARS-CoV-2, NAA: NOT DETECTED

## 2019-06-28 ENCOUNTER — Telehealth: Payer: Self-pay | Admitting: General Practice

## 2019-06-28 NOTE — Telephone Encounter (Signed)
Negative COVID results given. Patient results "NOT Detected." Caller expressed understanding. ° °

## 2021-07-25 DIAGNOSIS — T81534A Perforation due to foreign body accidentally left in body following endoscopic examination, initial encounter: Secondary | ICD-10-CM | POA: Insufficient documentation

## 2023-09-23 ENCOUNTER — Emergency Department (HOSPITAL_COMMUNITY)

## 2023-09-23 ENCOUNTER — Inpatient Hospital Stay (HOSPITAL_COMMUNITY)
Admission: EM | Admit: 2023-09-23 | Discharge: 2023-09-25 | DRG: 392 | Disposition: A | Discharge status: Home / Self Care | Attending: Student | Admitting: Student

## 2023-09-23 ENCOUNTER — Other Ambulatory Visit: Payer: Self-pay

## 2023-09-23 ENCOUNTER — Encounter (HOSPITAL_COMMUNITY): Payer: Self-pay

## 2023-09-23 DIAGNOSIS — E66811 Obesity, class 1: Secondary | ICD-10-CM | POA: Diagnosis present

## 2023-09-23 DIAGNOSIS — G8929 Other chronic pain: Secondary | ICD-10-CM

## 2023-09-23 DIAGNOSIS — R1319 Other dysphagia: Secondary | ICD-10-CM | POA: Diagnosis present

## 2023-09-23 DIAGNOSIS — R079 Chest pain, unspecified: Secondary | ICD-10-CM | POA: Diagnosis not present

## 2023-09-23 DIAGNOSIS — Z01810 Encounter for preprocedural cardiovascular examination: Secondary | ICD-10-CM

## 2023-09-23 DIAGNOSIS — R1314 Dysphagia, pharyngoesophageal phase: Secondary | ICD-10-CM | POA: Diagnosis present

## 2023-09-23 DIAGNOSIS — I160 Hypertensive urgency: Secondary | ICD-10-CM

## 2023-09-23 DIAGNOSIS — R0789 Other chest pain: Secondary | ICD-10-CM

## 2023-09-23 DIAGNOSIS — R1310 Dysphagia, unspecified: Secondary | ICD-10-CM

## 2023-09-23 DIAGNOSIS — R1013 Epigastric pain: Secondary | ICD-10-CM

## 2023-09-23 DIAGNOSIS — K222 Esophageal obstruction: Principal | ICD-10-CM | POA: Diagnosis present

## 2023-09-23 DIAGNOSIS — Z7982 Long term (current) use of aspirin: Secondary | ICD-10-CM

## 2023-09-23 DIAGNOSIS — I700 Atherosclerosis of aorta: Secondary | ICD-10-CM

## 2023-09-23 DIAGNOSIS — R17 Unspecified jaundice: Secondary | ICD-10-CM | POA: Diagnosis present

## 2023-09-23 DIAGNOSIS — K219 Gastro-esophageal reflux disease without esophagitis: Secondary | ICD-10-CM

## 2023-09-23 DIAGNOSIS — I129 Hypertensive chronic kidney disease with stage 1 through stage 4 chronic kidney disease, or unspecified chronic kidney disease: Secondary | ICD-10-CM | POA: Diagnosis present

## 2023-09-23 DIAGNOSIS — R935 Abnormal findings on diagnostic imaging of other abdominal regions, including retroperitoneum: Secondary | ICD-10-CM

## 2023-09-23 DIAGNOSIS — I7121 Aneurysm of the ascending aorta, without rupture: Secondary | ICD-10-CM | POA: Diagnosis present

## 2023-09-23 DIAGNOSIS — E785 Hyperlipidemia, unspecified: Secondary | ICD-10-CM | POA: Diagnosis present

## 2023-09-23 DIAGNOSIS — K2970 Gastritis, unspecified, without bleeding: Secondary | ICD-10-CM | POA: Diagnosis present

## 2023-09-23 DIAGNOSIS — I10 Essential (primary) hypertension: Secondary | ICD-10-CM

## 2023-09-23 DIAGNOSIS — R112 Nausea with vomiting, unspecified: Secondary | ICD-10-CM | POA: Diagnosis not present

## 2023-09-23 DIAGNOSIS — K223 Perforation of esophagus: Secondary | ICD-10-CM | POA: Diagnosis present

## 2023-09-23 DIAGNOSIS — N182 Chronic kidney disease, stage 2 (mild): Secondary | ICD-10-CM | POA: Diagnosis present

## 2023-09-23 DIAGNOSIS — Z603 Acculturation difficulty: Secondary | ICD-10-CM | POA: Diagnosis present

## 2023-09-23 DIAGNOSIS — Z720 Tobacco use: Secondary | ICD-10-CM

## 2023-09-23 DIAGNOSIS — I161 Hypertensive emergency: Secondary | ICD-10-CM | POA: Diagnosis present

## 2023-09-23 DIAGNOSIS — Z79899 Other long term (current) drug therapy: Secondary | ICD-10-CM

## 2023-09-23 DIAGNOSIS — Z6830 Body mass index (BMI) 30.0-30.9, adult: Secondary | ICD-10-CM

## 2023-09-23 LAB — LIPASE, BLOOD: Lipase: 29 U/L (ref 11–51)

## 2023-09-23 LAB — CBC
HCT: 46.2 % (ref 39.0–52.0)
Hemoglobin: 16 g/dL (ref 13.0–17.0)
MCH: 30.8 pg (ref 26.0–34.0)
MCHC: 34.6 g/dL (ref 30.0–36.0)
MCV: 88.8 fL (ref 80.0–100.0)
Platelets: 130 10*3/uL — ABNORMAL LOW (ref 150–400)
RBC: 5.2 MIL/uL (ref 4.22–5.81)
RDW: 14.2 % (ref 11.5–15.5)
WBC: 8.7 10*3/uL (ref 4.0–10.5)
nRBC: 0 % (ref 0.0–0.2)

## 2023-09-23 LAB — SURGICAL PCR SCREEN
MRSA, PCR: NEGATIVE
Staphylococcus aureus: NEGATIVE

## 2023-09-23 LAB — ETHANOL: Alcohol, Ethyl (B): <15 mg/dL (ref <15)

## 2023-09-23 LAB — COMPREHENSIVE METABOLIC PANEL WITH GFR
ALT: 22 U/L (ref 0–44)
AST: 30 U/L (ref 15–41)
Albumin: 3.4 g/dL — ABNORMAL LOW (ref 3.5–5.0)
Alkaline Phosphatase: 38 U/L (ref 38–126)
Anion gap: 9 (ref 5–15)
BUN: 11 mg/dL (ref 8–23)
CO2: 24 mmol/L (ref 22–32)
Calcium: 9.1 mg/dL (ref 8.9–10.3)
Chloride: 106 mmol/L (ref 98–111)
Creatinine, Ser: 1.28 mg/dL — ABNORMAL HIGH (ref 0.61–1.24)
GFR, Estimated: >60 mL/min (ref >60)
Glucose, Bld: 92 mg/dL (ref 70–99)
Potassium: 4.1 mmol/L (ref 3.5–5.1)
Sodium: 139 mmol/L (ref 135–145)
Total Bilirubin: 1.3 mg/dL — ABNORMAL HIGH (ref 0.0–1.2)
Total Protein: 7.9 g/dL (ref 6.5–8.1)

## 2023-09-23 LAB — TROPONIN I (HIGH SENSITIVITY)
Troponin I (High Sensitivity): 10 ng/L (ref <18)
Troponin I (High Sensitivity): 5 ng/L (ref <18)

## 2023-09-23 LAB — HEMOGLOBIN A1C
Hgb A1c MFr Bld: 4.9 % (ref 4.8–5.6)
Mean Plasma Glucose: 93.93 mg/dL

## 2023-09-23 LAB — CBC WITH DIFFERENTIAL/PLATELET
Abs Immature Granulocytes: 0.03 10*3/uL (ref 0.00–0.07)
Basophils Absolute: 0.1 10*3/uL (ref 0.0–0.1)
Basophils Relative: 1 %
Eosinophils Absolute: 0.3 10*3/uL (ref 0.0–0.5)
Eosinophils Relative: 3 %
HCT: 46.9 % (ref 39.0–52.0)
Hemoglobin: 16.3 g/dL (ref 13.0–17.0)
Immature Granulocytes: 0 %
Lymphocytes Relative: 36 %
Lymphs Abs: 3.1 10*3/uL (ref 0.7–4.0)
MCH: 30.7 pg (ref 26.0–34.0)
MCHC: 34.8 g/dL (ref 30.0–36.0)
MCV: 88.3 fL (ref 80.0–100.0)
Monocytes Absolute: 0.7 10*3/uL (ref 0.1–1.0)
Monocytes Relative: 8 %
Neutro Abs: 4.5 10*3/uL (ref 1.7–7.7)
Neutrophils Relative %: 52 %
Platelets: 133 10*3/uL — ABNORMAL LOW (ref 150–400)
RBC: 5.31 MIL/uL (ref 4.22–5.81)
RDW: 14.2 % (ref 11.5–15.5)
WBC: 8.7 10*3/uL (ref 4.0–10.5)
nRBC: 0 % (ref 0.0–0.2)

## 2023-09-23 LAB — T4, FREE: Free T4: 0.75 ng/dL (ref 0.61–1.12)

## 2023-09-23 LAB — BILIRUBIN, FRACTIONATED(TOT/DIR/INDIR)
Bilirubin, Direct: 0.3 mg/dL — ABNORMAL HIGH (ref 0.0–0.2)
Indirect Bilirubin: 1.5 mg/dL — ABNORMAL HIGH (ref 0.3–0.9)
Total Bilirubin: 1.8 mg/dL — ABNORMAL HIGH (ref 0.0–1.2)

## 2023-09-23 LAB — CREATININE, SERUM
Creatinine, Ser: 1.25 mg/dL — ABNORMAL HIGH (ref 0.61–1.24)
GFR, Estimated: >60 mL/min (ref >60)

## 2023-09-23 LAB — HIV ANTIBODY (ROUTINE TESTING W REFLEX): HIV Screen 4th Generation wRfx: NONREACTIVE

## 2023-09-23 LAB — MAGNESIUM: Magnesium: 1.4 mg/dL — ABNORMAL LOW (ref 1.7–2.4)

## 2023-09-23 LAB — TSH: TSH: 1.275 u[IU]/mL (ref 0.350–4.500)

## 2023-09-23 MED ORDER — MUPIROCIN 2 % EX OINT
1.0000 | TOPICAL_OINTMENT | Freq: Two times a day (BID) | CUTANEOUS | Status: DC
  Administered 2023-09-23 – 2023-09-25 (×2): 1 via NASAL
  Filled 2023-09-23 (×2): qty 22

## 2023-09-23 MED ORDER — HYDRALAZINE HCL 20 MG/ML IJ SOLN: 10.0000 mg | Freq: Three times a day (TID) | INTRAMUSCULAR | Status: DC

## 2023-09-23 MED ORDER — ONDANSETRON HCL 4 MG/2ML IJ SOLN
4.0000 mg | Freq: Once | INTRAMUSCULAR | Status: AC
  Administered 2023-09-23: 4 mg via INTRAVENOUS
  Filled 2023-09-23: qty 2

## 2023-09-23 MED ORDER — HEPARIN SODIUM (PORCINE) 5000 UNIT/ML IJ SOLN
5000.0000 [IU] | Freq: Three times a day (TID) | INTRAMUSCULAR | Status: DC
  Administered 2023-09-23 – 2023-09-25 (×5): 5000 [IU] via SUBCUTANEOUS
  Filled 2023-09-23 (×6): qty 1

## 2023-09-23 MED ORDER — NITROGLYCERIN 0.4 MG SL SUBL: 0.4000 mg | SUBLINGUAL_TABLET | SUBLINGUAL | Status: DC | PRN

## 2023-09-23 MED ORDER — HYDRALAZINE HCL 20 MG/ML IJ SOLN
10.0000 mg | Freq: Three times a day (TID) | INTRAMUSCULAR | Status: AC
  Administered 2023-09-23 – 2023-09-25 (×6): 10 mg via INTRAVENOUS
  Filled 2023-09-23 (×6): qty 1

## 2023-09-23 MED ORDER — PANTOPRAZOLE SODIUM 40 MG IV SOLR
40.0000 mg | Freq: Two times a day (BID) | INTRAVENOUS | Status: DC
  Administered 2023-09-23 – 2023-09-25 (×5): 40 mg via INTRAVENOUS
  Filled 2023-09-23 (×5): qty 10

## 2023-09-23 MED ORDER — ONDANSETRON HCL 4 MG/2ML IJ SOLN: 4.0000 mg | Freq: Four times a day (QID) | INTRAMUSCULAR | Status: DC | PRN

## 2023-09-23 MED ORDER — SUCRALFATE 1 GM/10ML PO SUSP
1.0000 g | Freq: Three times a day (TID) | ORAL | Status: DC
  Administered 2023-09-23 – 2023-09-25 (×6): 1 g via ORAL
  Filled 2023-09-23 (×6): qty 10

## 2023-09-23 MED ORDER — LACTATED RINGERS IV SOLN: INTRAVENOUS | Status: DC

## 2023-09-23 MED ORDER — MORPHINE SULFATE (PF) 4 MG/ML IV SOLN
4.0000 mg | Freq: Once | INTRAVENOUS | Status: AC
  Administered 2023-09-23: 4 mg via INTRAVENOUS
  Filled 2023-09-23: qty 1

## 2023-09-23 MED ORDER — LABETALOL HCL 5 MG/ML IV SOLN
10.0000 mg | Freq: Once | INTRAVENOUS | Status: DC
  Filled 2023-09-23: qty 4

## 2023-09-23 MED ORDER — HYDRALAZINE HCL 20 MG/ML IJ SOLN
10.0000 mg | Freq: Once | INTRAMUSCULAR | Status: AC
  Administered 2023-09-23: 10 mg via INTRAVENOUS
  Filled 2023-09-23: qty 1

## 2023-09-23 MED ORDER — IOHEXOL 350 MG/ML SOLN
80.0000 mL | Freq: Once | INTRAVENOUS | Status: AC | PRN
  Administered 2023-09-23: 80 mL via INTRAVENOUS

## 2023-09-23 MED ORDER — ACETAMINOPHEN 325 MG PO TABS
650.0000 mg | ORAL_TABLET | ORAL | Status: DC | PRN
  Administered 2023-09-24 – 2023-09-25 (×3): 650 mg via ORAL
  Filled 2023-09-23 (×3): qty 2

## 2023-09-23 MED ORDER — HYDRALAZINE HCL 20 MG/ML IJ SOLN: 10.0000 mg | INTRAMUSCULAR | Status: DC | PRN

## 2023-09-23 MED ORDER — HYDRALAZINE HCL 20 MG/ML IJ SOLN: 5.0000 mg | INTRAMUSCULAR | Status: DC | PRN

## 2023-09-23 NOTE — H&P (Signed)
 History and Physical    Patient: [name] Daugherty ZOX:[id number] DOB: 18-Sep-1955 DOA: 09/23/2023 DOS: the patient was seen and examined on 09/23/2023 PCP: Patient, No Pcp Per  Patient coming from: Home Chief complaint: Chief Complaint  Patient presents with   Chest Pain   HPI:  [name] Daugherty is a 68 y.o. male with past medical history of essential hypertension currently on amlodipine  valsartan and Coreg, hypertensive emergency, esophageal perforation history, ascending aortic aneurysm-echocardiogram in January 2025 shows mild dilated ascending aorta measuring 4.1 cm, aortic root measuring 3.8 cm, ejection fraction of 55 to 60% and otherwise essentially normal echocardiogram with no wall motion abnormality,, followed by [facility] cardiology, tobacco chewing. ED Course: Pt in ed at bedside  is alert awake oriented blood pressure is improving slowly he received labetalol  x 1 after discussing case with the ED provider will administer hydralazine  see his response to 10 mg and increase to 20 goal blood pressure lowering to systolic of 150s. Vital signs in the ED were notable for the following:  Vitals:   09/23/23 1130 09/23/23 1221 09/23/23 1230 09/23/23 1245  BP: (!) 182/87 (!) 156/87 (!) 165/81 (!) 175/89  Pulse: (!) 52 (!) 55 (!) 51 (!) 59  Temp:  97.9 F (36.6 C)    Resp: 14 18 14 19   Height:      Weight:      SpO2: 100% 97% 98% 98%  TempSrc:  Oral    BMI (Calculated):      >>ED evaluation thus far shows: EKG shows LVH, sinus rhythm at 68, QTc of 435, no ST or T wave changes, normal troponin x 1. CT angio done shows ascending aorta up to 4 cm, gastritis. CMP shows AKI of 1.28 EGFR of more than 68 total bili 1.3 otherwise normal electrolytes and LFTs. CBC shows  platelet count of 133 otherwise normal white count of 8.7 hemoglobin 16.3 normal differentials . Chest xray is negative.  RUQ abd US : negative for any  No gallstones or ductal dilatation. Fatty liver infiltration.   >>While in the  ED patient received the following: Medications  labetalol  (NORMODYNE ) injection 10 mg (has no administration in time range)  ondansetron  (ZOFRAN ) injection 4 mg (4 mg Intravenous Given 09/23/23 1154)  morphine  (PF) 4 MG/ML injection 4 mg (4 mg Intravenous Given 09/23/23 1155)  iohexol  (OMNIPAQUE ) 350 MG/ML injection 80 mL (80 mLs Intravenous Contrast Given 09/23/23 1330)   Review of Systems  Respiratory:  Positive for cough.   Cardiovascular:  Positive for chest pain.  Gastrointestinal:  Positive for nausea and vomiting.   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   Due to retained foreign body    Hypertension    Past Surgical History:  Procedure Laterality Date   ESOPHAGOGASTRODUODENOSCOPY N/A 08/04/2014   Procedure: FOREIGN BODY REMOVAL ( ESOPHAGEAL MASS );  Surgeon: Tami Falcon, MD;  Location: [facility] OR;  Service: Endoscopy;  Laterality: N/A;   ESOPHAGOGASTRODUODENOSCOPY Left 08/03/2014   Procedure: ESOPHAGOGASTRODUODENOSCOPY (EGD);  Surgeon: Tami Falcon, MD;  Location: [facility] ENDOSCOPY;  Service: Endoscopy;  Laterality: Left;    reports that he has been smoking. He uses smokeless tobacco. He reports current alcohol use. He reports that he does not use drugs. No Known Allergies Family History  Problem Relation Age of Onset   Sudden Cardiac Death Neg Hx    Prior to Admission medications   Medication Sig Start Date End Date Taking? Authorizing Provider  amLODipine  (NORVASC ) 5 MG tablet Take 5 mg by  mouth daily.    [provider]  amLODipine  (NORVASC ) 5 MG tablet Take 1 tablet (5 mg total) by mouth daily. 09/11/18   Dodie Frees, MD  metoCLOPramide  (REGLAN ) 5 MG tablet Take 5 mg by mouth 3 (three) times daily before meals.    [provider]  metoprolol  tartrate (LOPRESSOR ) 25 MG tablet Take 0.5 tablets (12.5 mg total) by mouth 2 (two) times daily. 04/29/17   Chere Cordon, MD  pantoprazole  (PROTONIX ) 40 MG tablet TAKE 40MG  BY MOUTH DAILY  AS NEEDED FOR ACID REFLUX 20 MINUTES BEFORE BREAKFAST 05/30/15   [provider]                                                                                 Vitals:   09/23/23 1130 09/23/23 1221 09/23/23 1230 09/23/23 1245  BP: (!) 182/87 (!) 156/87 (!) 165/81 (!) 175/89  Pulse: (!) 52 (!) 55 (!) 51 (!) 59  Resp: 14 18 14 19   Temp:  97.9 F (36.6 C)    TempSrc:  Oral    SpO2: 100% 97% 98% 98%  Weight:      Height:       Physical Exam Vitals and nursing note reviewed.  Constitutional:      General: He is not in acute distress. HENT:     Head: Normocephalic and atraumatic.     Right Ear: Hearing normal.     Left Ear: Hearing normal.     Nose: No nasal deformity.     Mouth/Throat:     Lips: Pink.  Eyes:     General: Lids are normal.     Extraocular Movements: Extraocular movements intact.  Cardiovascular:     Rate and Rhythm: Normal rate and regular rhythm.     Pulses: Normal pulses.     Heart sounds: Normal heart sounds.  Pulmonary:     Effort: Pulmonary effort is normal.     Breath sounds: Normal breath sounds.  Abdominal:     General: Bowel sounds are normal. There is no distension.     Palpations: Abdomen is soft. There is no mass.     Tenderness: There is no abdominal tenderness.  Musculoskeletal:     Right lower leg: No edema.     Left lower leg: No edema.  Skin:    General: Skin is warm.  Neurological:     General: No focal deficit present.     Mental Status: He is alert and oriented to person, place, and time.     Cranial Nerves: Cranial nerves 2-12 are intact.  Psychiatric:        Speech: Speech normal.     Labs on Admission: I have personally reviewed following labs and imaging studies CBC: Recent Labs  Lab 09/23/23 1111  WBC 8.7  NEUTROABS 4.5  HGB 16.3  HCT 46.9  MCV 88.3  PLT 133*   Basic Metabolic Panel: Recent Labs  Lab 09/23/23 1111  NA 139  K 4.1  CL 106  CO2 24  GLUCOSE 92  BUN 11  CREATININE 1.28*  CALCIUM 9.1    GFR: Estimated Creatinine Clearance: 47.4 mL/min (A) (by C-G formula based on SCr of 1.28  mg/dL (H)). Liver Function Tests: Recent Labs  Lab 09/23/23 1111  AST 30  ALT 22  ALKPHOS 38  BILITOT 1.3*  PROT 7.9  ALBUMIN 3.4*   Recent Labs  Lab 09/23/23 1111  LIPASE 29   No results for input(s): "AMMONIA" in the last 168 hours. Coagulation Profile: No results for input(s): "INR", "PROTIME" in the last 168 hours. Cardiac Enzymes: No results for input(s): "CKTOTAL", "CKMB", "CKMBINDEX", "TROPONINI" in the last 168 hours. BNP (last 3 results) No results for input(s): "PROBNP" in the last 8760 hours. HbA1C: No results for input(s): "HGBA1C" in the last 72 hours. CBG: No results for input(s): "GLUCAP" in the last 168 hours. Lipid Profile: No results for input(s): "CHOL", "HDL", "LDLCALC", "TRIG", "CHOLHDL", "LDLDIRECT" in the last 72 hours. Thyroid Function Tests: No results for input(s): "TSH", "T4TOTAL", "FREET4", "T3FREE", "THYROIDAB" in the last 72 hours. Anemia Panel: No results for input(s): "VITAMINB12", "FOLATE", "FERRITIN", "TIBC", "IRON", "RETICCTPCT" in the last 72 hours. Urine analysis: No results found for: "COLORURINE", "APPEARANCEUR", "LABSPEC", "PHURINE", "GLUCOSEU", "HGBUR", "BILIRUBINUR", "KETONESUR", "PROTEINUR", "UROBILINOGEN", "NITRITE", "LEUKOCYTESUR" Radiological Exams on Admission: CT Angio Chest/Abd/Pel for Dissection W and/or W/WO Result Date: 09/23/2023 CLINICAL DATA:  Chest pain. Concern for aortic aneurysm or dissection. EXAM: CT ANGIOGRAPHY CHEST, ABDOMEN AND PELVIS TECHNIQUE: Non-contrast CT of the chest was initially obtained. Multidetector CT imaging through the chest, abdomen and pelvis was performed using the standard protocol during bolus administration of intravenous contrast. Multiplanar reconstructed images and MIPs were obtained and reviewed to evaluate the vascular anatomy. RADIATION DOSE REDUCTION: This exam was performed according to the  departmental dose-optimization program which includes automated exposure control, adjustment of the mA and/or kV according to patient size and/or use of iterative reconstruction technique. CONTRAST:  80mL OMNIPAQUE  IOHEXOL  350 MG/ML SOLN COMPARISON:  CT dated 04/28/2017. FINDINGS: CTA CHEST FINDINGS Cardiovascular: There is no cardiomegaly or pericardial effusion. Top-normal ascending aorta measures 4 cm in diameter. No aortic dissection. The origins of the great vessels of the aortic arch appear patent. No pulmonary artery embolus identified. Mediastinum/Nodes: No hilar or mediastinal adenopathy. The esophagus is grossly unremarkable. No mediastinal fluid collection. Lungs/Pleura: No focal consolidation, pleural effusion, or pneumothorax. The central airways are patent. Musculoskeletal: No acute osseous pathology. Review of the MIP images confirms the above findings. CTA ABDOMEN AND PELVIS FINDINGS VASCULAR Aorta: Mild atherosclerotic calcification. No aneurysmal dilatation or dissection. No periaortic fluid collection. Celiac: Patent without evidence of aneurysm, dissection, vasculitis or significant stenosis. SMA: Patent without evidence of aneurysm, dissection, vasculitis or significant stenosis. Renals: The left renal artery is patent. There is focal area of high-grade narrowing of the proximal right renal artery. The right renal artery remains patent. IMA: The IMA is patent. Inflow: The iliac arteries are patent. No aneurysmal dilatation or dissection. Veins: No obvious venous abnormality within the limitations of this arterial phase study. Review of the MIP images confirms the above findings. NON-VASCULAR No intra-abdominal free air or free fluid. Hepatobiliary: The liver is unremarkable. No biliary dilatation. The gallbladder is unremarkable Pancreas: Unremarkable. No pancreatic ductal dilatation or surrounding inflammatory changes. Spleen: Normal in size without focal abnormality. Adrenals/Urinary Tract:  The adrenal glands unremarkable. The kidneys, visualized ureters, and urinary bladder appear unremarkable. Stomach/Bowel: There is thickened appearance of the distal stomach involving the gastric antrum and pylorus with mild adjacent stranding which may represent gastritis. There is no bowel obstruction. The appendix is normal. Lymphatic: No adenopathy. Reproductive: The prostate and seminal vesicles are grossly unremarkable. Other: None Musculoskeletal: No acute or  significant osseous findings. Review of the MIP images confirms the above findings. IMPRESSION: 1. No acute intrathoracic pathology. Top-normal ascending aorta measures up to 4 cm in diameter. Recommend annual imaging followup by CTA or MRA. This recommendation follows 2010 ACCF/AHA/AATS/ACR/ASA/SCA/SCAI/SIR/STS/SVM Guidelines for the Diagnosis and Management of Patients with Thoracic Aortic Disease. Circulation. 2010; 121: Z610-R604. Aortic aneurysm NOS (ICD10-I71.9) 2. Possible distal gastritis. Clinical correlation is recommended. No bowel obstruction. Normal appendix. Electronically Signed   By: Angus Bark M.D.   On: 09/23/2023 13:45   DG Chest Port 1 View Result Date: 09/23/2023 CLINICAL DATA:  Chest pain. EXAM: PORTABLE CHEST 1 VIEW COMPARISON:  Chest radiograph dated 11/11/2018. FINDINGS: No focal consolidation, pleural effusion, or pneumothorax. Stable cardiac silhouette. No acute osseous pathology. IMPRESSION: No active disease. Electronically Signed   By: Angus Bark M.D.   On: 09/23/2023 13:03   US  Abdomen Limited RUQ (LIVER/GB) Result Date: 09/23/2023 CLINICAL DATA:  Right upper quadrant pain EXAM: ULTRASOUND ABDOMEN LIMITED RIGHT UPPER QUADRANT COMPARISON:  CT 12/20/1916. FINDINGS: Gallbladder: No gallstones or wall thickening visualized. No sonographic Murphy sign noted by sonographer. Common bile duct: Diameter: 4 mm Liver: Diffusely echogenic hepatic parenchyma consistent with fatty liver infiltration. With this level of  echogenicity evaluation for underlying mass lesion is limited and if needed follow-up contrast CT or MRI as clinically appropriate. Portal vein is patent on color Doppler imaging with normal direction of blood flow towards the liver. Other: None. IMPRESSION: No gallstones or ductal dilatation.  Fatty liver infiltration. Electronically Signed   By: Adrianna Horde M.D.   On: 09/23/2023 11:58   Data Reviewed: Relevant notes from primary care and specialist visits, past discharge summaries as available in EHR, including Care Everywhere. Prior diagnostic testing as pertinent to current admission diagnoses, Updated medications and problem lists for reconciliation ED course, including vitals, labs, imaging, treatment and response to treatment,Triage notes, nursing and pharmacy notes and ED provider's notes Notable results as noted in HPI.Discussed case with EDMD/ ED APP/ or Specialty MD on call and as needed.  Assessment & Plan  >>Chest pain: Pt complaints of midsternal chest pain progressively worse over past 15 days with dysphagia. We will follow troponin and consult cardiology for ischemic eval. Previous echo as  above. Prn NTG.EKG and troponin negative so far.  Attribute to UGI source but pt is at moderate risk to high risk for coronary ischemia and have requested cardiology to see him.GI provider at bedside and pt to have EGD in am npo after MN. IV PPI and carafate  if tolerated.  >>Hypertensive Emergency: Vitals:   09/23/23 1051 09/23/23 1100 09/23/23 1115 09/23/23 1130  BP: (!) 232/95 (!) 170/100 (!) 173/102 (!) 182/87   09/23/23 1221 09/23/23 1230 09/23/23 1245  BP: (!) 156/87 (!) 165/81 (!) 175/89  Cont pt on scheduled hydralazine  10 mg tid and PRN hydralazine  iv as he cannot swallow.   >> Aortic dilattation: 4 cm on cta today.  Goal is  BP control and optimization.  Stop tobaco. Pt counseled extensively on GI and GI cancer and side effect of ongoing tobacco chewing and any tobacco products.  Pt verbalized understanding and states he is working on cutting back and encouraged to do the same.   >>Tobacco abuse: Nicotine patch.    >>AKI: Lab Results  Component Value Date   CREATININE 1.28 (H) 09/23/2023   CREATININE 1.18 09/11/2018   CREATININE 1.07 04/27/2017  Avoid contrasts and follow CIN prevention practice if worsening.  We will continue with hydration.  DVT prophylaxis:  Heparin  . Consults:  GI consult [facility].  Cardiology: cardiology master.  Advance Care Planning:    Code Status: Full Code  Family Communication:  Shellee Devonshire [phone]. Disposition Plan:  Home  Severity of Illness: The appropriate patient status for this patient is OBSERVATION. Observation status is judged to be reasonable and necessary in order to provide the required intensity of service to ensure the patient's safety. The patient's presenting symptoms, physical exam findings, and initial radiographic and laboratory data in the context of their medical condition is felt to place them at decreased risk for further clinical deterioration. Furthermore, it is anticipated that the patient will be medically stable for discharge from the hospital within 2 midnights of admission.   Unresulted Labs (From admission, onward)     Start     Ordered   09/23/23 1522  CBC  (heparin )  Once,   R       Comments: Baseline for heparin  therapy IF NOT ALREADY DRAWN.  Notify MD if PLT < 100 K.    09/23/23 1522   09/23/23 1522  Creatinine, serum  (heparin )  Once,   R       Comments: Baseline for heparin  therapy IF NOT ALREADY DRAWN.    09/23/23 1522   09/23/23 1521  HIV Antibody (routine testing w rflx)  (HIV Antibody (Routine testing w reflex) panel)  Once,   R        09/23/23 1522   09/23/23 1521  Hemoglobin A1c  Add-on,   AD        09/23/23 1522   09/23/23 1521  T4, free  Add-on,   AD        09/23/23 1522   09/23/23 1521  TSH  Add-on,   AD        09/23/23 1522   09/23/23 1441  Ethanol  Add-on,   AD         09/23/23 1440   09/23/23 1423  Magnesium  Add-on,   AD        09/23/23 1422   09/23/23 1111  Urinalysis, Routine w reflex microscopic -Urine, Clean Catch  (ED Abdominal Pain)  Once,   URGENT       Question:  Specimen Source  Answer:  Urine, Clean Catch   09/23/23 1111            Orders Placed This Encounter  Procedures   Critical Care   DG Chest Port 1 View   CT Angio Chest/Abd/Pel for Dissection W and/or W/WO   US  Abdomen Limited RUQ (LIVER/GB)   CBC with Differential   Comprehensive metabolic panel   Lipase, blood   Urinalysis, Routine w reflex microscopic -Urine, Clean Catch   Magnesium   Ethanol   HIV Antibody (routine testing w rflx)   Hemoglobin A1c   T4, free   TSH   CBC   Creatinine, serum   Diet NPO time specified   Swallow screen   Refer to Sidebar Report Refer to ICU, Med-Surg, Progressive, and Step-Down Mobility Protocol Sidebars   Apply Angina, Rule Out Myocardial Infarction Care Plan   Vital signs with O2 sat q4 hours x 24 hours, then q shift   RN may order Cardiology PRN Orders utilizing "Cardiology PRN medications" (through manage orders) for the following patient needs:   Mobility Protocol: No Restrictions RN to initiate protocols based on patient's level of care   Cardiac Monitoring - Continuous Indefinite   Measure blood pressure   Full code  Consult for [facility] Admission   Consult to gastroenterology Consult Timeframe: ROUTINE - requires response within 24 hours; Reason for Consult? Chest pain - suspect from UGI ?PUD or strictures or cancer.   Inpatient consult to Cardiology Consult Timeframe: ROUTINE - requires response within 24 hours; Reason for Consult? chest pain.   EKG 12-Lead   EKG 12-Lead (recurrent chest pain)   Insert peripheral IV   Place in observation (patient's expected length of stay will be less than 2 midnights)   Place in observation (patient's expected length of stay will be less than 2 midnights)   Aspiration  precautions   Fall precautions    Author: Lavanda Porter, MD 12 pm -8 pm. 09/23/2023 3:26 PM >>Please note for any concern,or critical results after hours past 8pm please contact the [facility] hospital[facility] floor coverage provider from 7 PM- 7 AM. For on call review [email or web address], username [email or web address] and PW: your phone number<<

## 2023-09-23 NOTE — Consult Note (Signed)
 Cardiology Consultation   [name] Daugherty ID: [name] Daugherty MRN: [id number]; DOB: 1955/05/30  Admit date: 09/23/2023 Date of Consult: 09/23/2023  PCP:  [name] Daugherty, No Pcp Per   [facility]  Cardiologist:  [facility]   [name] Daugherty Profile:   [name] Daugherty is a 68 y.o. male with a hx of HTN, esophogeal perforation, AAA, who is being seen 09/23/2023 for the evaluation of chest pain, preop evaluation at the request of Dr. Lydia Sams.  History of Present Illness:   [name] Daugherty has past medical history as stated above. He presented to [facility] ED on 09/23/2023 complaining of chest pain. He reported centralized, non radiating chest pain x 1 week. States he also had 10 days of vomiting, no diarrhea. Was seen by PCP, found his BP to be 220/120 and was sent to the ED for further evaluation.  Relevant workup in the ED: EKG showed sinus rhythm, HR 68 no acute ischemic changes, CBC showed thrombocytopenia at 133k, CMP showed creatinine at 1.28, albumin 3.4, normal lipase, RUQ U/S showed no gallstones or CBD dilation, did show fatty liver infiltration, CXR negative, CTPA showed gastritis and dilated ascending aorta, troponin negative.  [name] Daugherty was given hydralazine  10 mg x 2 with improvement of BP from 232/95 to 188/85.   [name] Daugherty was admitted to medicine service with GI and cardiology as consult services. GI has seen [name] Daugherty with thoughts of chest pain being related to upper GI source. They are scheduling an EGD for the [name] Daugherty. Cardiology was brought on for pre-operative evaluation as well as to determine if [name] Daugherty requires ischemic workup.   Echocardiogram from 05/25/2023 [facility]) showed EF 55-60%, mild AR, mildly dilated AA.  After speaking with the [name] Daugherty via Nepali interpreter ID number [phone], he states that this chest pain he is having is actually epigastric pain that he experiences every time he eats/drinks.  He says that he has this pain only when he is eating and drinking.  He says that  sometimes he becomes nauseous and vomits.  He describes it all as acid reflux which he has previously suffered with.  He follows up with [facility] as an outpatient.  While he does have some risk factors for CAD, there is only minimal calcifications noted on CT scan.  [name] Daugherty would prefer to stay with [facility] providers for cardiology care.  He can undergo an ischemic evaluation as an outpatient with their practice.  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   Due to retained foreign body    Hypertension     Past Surgical History:  Procedure Laterality Date   ESOPHAGOGASTRODUODENOSCOPY N/A 08/04/2014   Procedure: FOREIGN BODY REMOVAL ( ESOPHAGEAL MASS );  Surgeon: Tami Falcon, MD;  Location: [facility] OR;  Service: Endoscopy;  Laterality: N/A;   ESOPHAGOGASTRODUODENOSCOPY Left 08/03/2014   Procedure: ESOPHAGOGASTRODUODENOSCOPY (EGD);  Surgeon: Tami Falcon, MD;  Location: [facility] ENDOSCOPY;  Service: Endoscopy;  Laterality: Left;     Home Medications:  Prior to Admission medications   Medication Sig Start Date End Date Taking? Authorizing Provider  amLODipine  (NORVASC ) 10 MG tablet Take 10 mg by mouth daily. 07/22/23   [provider]  aspirin  EC 81 MG tablet Take 1 tablet by mouth daily. 07/05/23 07/04/24  [provider]  carvedilol (COREG) 6.25 MG tablet Take 6.25 mg by mouth 2 (two) times daily with a meal. 07/05/23 07/04/24  [provider]  lisinopril (ZESTRIL) 20 MG tablet Take 20 mg by mouth daily. 07/22/23   [provider]  metoCLOPramide  (REGLAN ) 5 MG tablet Take 5 mg by mouth 3 (three) times daily before meals.    [provider]  pantoprazole  (PROTONIX ) 40 MG tablet Take 40 mg by mouth daily as needed (acid reflux 20 minutes before breakfast). 05/30/15   [provider]    Inpatient Medications: Scheduled Meds:  heparin   5,000 Units Subcutaneous Q8H   hydrALAZINE   10 mg Intravenous Q8H   pantoprazole   (PROTONIX ) IV  40 mg Intravenous Q12H   sucralfate   1 g Oral TID WC & HS   Continuous Infusions:  lactated ringers  75 mL/hr at 09/23/23 1603   PRN Meds: acetaminophen , hydrALAZINE , nitroGLYCERIN , ondansetron  (ZOFRAN ) IV  Allergies:   No Known Allergies  Social History:   Social History   Socioeconomic History   Marital status: Married    Spouse name: Not on file   Number of children: Not on file   Years of education: Not on file   Highest education level: Not on file  Occupational History   Not on file  Tobacco Use   Smoking status: Some Days   Smokeless tobacco: Current  Substance and Sexual Activity   Alcohol use: Yes    Comment: social   Drug use: No   Sexual activity: Not on file  Other Topics Concern   Not on file  Social History Narrative   Not on file   Social Drivers of Health   Financial Resource Strain: Not on file  Food Insecurity: Not on file  Transportation Needs: Not on file  Physical Activity: Not on file  Stress: Not on file  Social Connections: Not on file  Intimate Partner Violence: Not on file    Family History:   Family History  Problem Relation Age of Onset   Sudden Cardiac Death Neg Hx      ROS:  Please see the history of present illness.  All other ROS reviewed and negative.     Physical Exam/Data:   Vitals:   09/23/23 1230 09/23/23 1245 09/23/23 1550 09/23/23 1616  BP: (!) 165/81 (!) 175/89 (!) 188/85 (!) 148/75  Pulse: (!) 51 (!) 59  78  Resp: 14 19  20   Temp:    98 F (36.7 C)  TempSrc:      SpO2: 98% 98%  94%  Weight:      Height:       No intake or output data in the 24 hours ending 09/23/23 1646    09/23/2023   10:52 AM 09/11/2018    3:10 PM 04/29/2017    5:05 AM  Last 3 Weights  Weight (lbs) 160 lb 0.9 oz 160 lb 156 lb 12.8 oz  Weight (kg) 72.6 kg 72.576 kg 71.124 kg     Body mass index is 30.74 kg/m.   General:  Well nourished, well developed, in no acute distress HEENT: normal Neck: no JVD Vascular: No  carotid bruits; Distal pulses 2+ bilaterally Cardiac:  normal S1, S2; RRR; no murmur  Lungs:  clear to auscultation bilaterally, no wheezing, rhonchi or rales  Abd: soft, nontender, no hepatomegaly  Ext: no edema Musculoskeletal:  No deformities, BUE and BLE strength normal and equal Skin: warm and dry  Neuro:  CNs 2-12 intact, no focal abnormalities noted Psych:  Normal affect   EKG:  The EKG was personally reviewed and demonstrates: Sinus rhythm, HR 68 bpm  Relevant CV Studies: TTE, done by [facility], 05/25/2023 The left ventricular size is normal.  There is normal left ventricular  wall thickness.  Left ventricular systolic function is normal.  LV ejection fraction = 55-60%.  The left ventricular wall motion is normal.  There is mild aortic regurgitation.  No pulmonary hypertension.  Mildly dilated ascending aorta.  - Ascending aorta measures 4.1cm.  - Aortic root measures 3.8 cm.  No significant change compared to the prior study.    Laboratory Data:  High Sensitivity Troponin:   Recent Labs  Lab 09/23/23 1111 09/23/23 1429  TROPONINIHS 10 5     Chemistry Recent Labs  Lab 09/23/23 1111 09/23/23 1429  NA 139  --   K 4.1  --   CL 106  --   CO2 24  --   GLUCOSE 92  --   BUN 11  --   CREATININE 1.28*  --   CALCIUM 9.1  --   MG  --  1.4*  GFRNONAA >60  --   ANIONGAP 9  --     Recent Labs  Lab 09/23/23 1111  PROT 7.9  ALBUMIN 3.4*  AST 30  ALT 22  ALKPHOS 38  BILITOT 1.3*   Lipids No results for input(s): "CHOL", "TRIG", "HDL", "LABVLDL", "LDLCALC", "CHOLHDL" in the last 168 hours.  Hematology Recent Labs  Lab 09/23/23 1111 09/23/23 1521  WBC 8.7 8.7  RBC 5.31 5.20  HGB 16.3 16.0  HCT 46.9 46.2  MCV 88.3 88.8  MCH 30.7 30.8  MCHC 34.8 34.6  RDW 14.2 14.2  PLT 133* 130*   Thyroid No results for input(s): "TSH", "FREET4" in the last 168 hours.  BNPNo results for input(s): "BNP", "PROBNP" in the last 168 hours.  DDimer No results for  input(s): "DDIMER" in the last 168 hours.   Radiology/Studies:  CT Angio Chest/Abd/Pel for Dissection W and/or W/WO Result Date: 09/23/2023 CLINICAL DATA:  Chest pain. Concern for aortic aneurysm or dissection. EXAM: CT ANGIOGRAPHY CHEST, ABDOMEN AND PELVIS TECHNIQUE: Non-contrast CT of the chest was initially obtained. Multidetector CT imaging through the chest, abdomen and pelvis was performed using the standard protocol during bolus administration of intravenous contrast. Multiplanar reconstructed images and MIPs were obtained and reviewed to evaluate the vascular anatomy. RADIATION DOSE REDUCTION: This exam was performed according to the departmental dose-optimization program which includes automated exposure control, adjustment of the mA and/or kV according to [name] Daugherty size and/or use of iterative reconstruction technique. CONTRAST:  80mL OMNIPAQUE  IOHEXOL  350 MG/ML SOLN COMPARISON:  CT dated 04/28/2017. FINDINGS: CTA CHEST FINDINGS Cardiovascular: There is no cardiomegaly or pericardial effusion. Top-normal ascending aorta measures 4 cm in diameter. No aortic dissection. The origins of the great vessels of the aortic arch appear patent. No pulmonary artery embolus identified. Mediastinum/Nodes: No hilar or mediastinal adenopathy. The esophagus is grossly unremarkable. No mediastinal fluid collection. Lungs/Pleura: No focal consolidation, pleural effusion, or pneumothorax. The central airways are patent. Musculoskeletal: No acute osseous pathology. Review of the MIP images confirms the above findings. CTA ABDOMEN AND PELVIS FINDINGS VASCULAR Aorta: Mild atherosclerotic calcification. No aneurysmal dilatation or dissection. No periaortic fluid collection. Celiac: Patent without evidence of aneurysm, dissection, vasculitis or significant stenosis. SMA: Patent without evidence of aneurysm, dissection, vasculitis or significant stenosis. Renals: The left renal artery is patent. There is focal area of high-grade  narrowing of the proximal right renal artery. The right renal artery remains patent. IMA: The IMA is patent. Inflow: The iliac arteries are patent. No aneurysmal dilatation or dissection. Veins: No obvious venous abnormality within the limitations of this arterial phase study. Review of the MIP images  confirms the above findings. NON-VASCULAR No intra-abdominal free air or free fluid. Hepatobiliary: The liver is unremarkable. No biliary dilatation. The gallbladder is unremarkable Pancreas: Unremarkable. No pancreatic ductal dilatation or surrounding inflammatory changes. Spleen: Normal in size without focal abnormality. Adrenals/Urinary Tract: The adrenal glands unremarkable. The kidneys, visualized ureters, and urinary bladder appear unremarkable. Stomach/Bowel: There is thickened appearance of the distal stomach involving the gastric antrum and pylorus with mild adjacent stranding which may represent gastritis. There is no bowel obstruction. The appendix is normal. Lymphatic: No adenopathy. Reproductive: The prostate and seminal vesicles are grossly unremarkable. Other: None Musculoskeletal: No acute or significant osseous findings. Review of the MIP images confirms the above findings. IMPRESSION: 1. No acute intrathoracic pathology. Top-normal ascending aorta measures up to 4 cm in diameter. Recommend annual imaging followup by CTA or MRA. This recommendation follows 2010 ACCF/AHA/AATS/ACR/ASA/SCA/SCAI/SIR/STS/SVM Guidelines for the Diagnosis and Management of Patients with Thoracic Aortic Disease. Circulation. 2010; 121: Z308-M578. Aortic aneurysm NOS (ICD10-I71.9) 2. Possible distal gastritis. Clinical correlation is recommended. No bowel obstruction. Normal appendix. Electronically Signed   By: Angus Bark M.D.   On: 09/23/2023 13:45   DG Chest Port 1 View Result Date: 09/23/2023 CLINICAL DATA:  Chest pain. EXAM: PORTABLE CHEST 1 VIEW COMPARISON:  Chest radiograph dated 11/11/2018. FINDINGS: No focal  consolidation, pleural effusion, or pneumothorax. Stable cardiac silhouette. No acute osseous pathology. IMPRESSION: No active disease. Electronically Signed   By: Angus Bark M.D.   On: 09/23/2023 13:03   US  Abdomen Limited RUQ (LIVER/GB) Result Date: 09/23/2023 CLINICAL DATA:  Right upper quadrant pain EXAM: ULTRASOUND ABDOMEN LIMITED RIGHT UPPER QUADRANT COMPARISON:  CT 12/20/1916. FINDINGS: Gallbladder: No gallstones or wall thickening visualized. No sonographic Murphy sign noted by sonographer. Common bile duct: Diameter: 4 mm Liver: Diffusely echogenic hepatic parenchyma consistent with fatty liver infiltration. With this level of echogenicity evaluation for underlying mass lesion is limited and if needed follow-up contrast CT or MRI as clinically appropriate. Portal vein is patent on color Doppler imaging with normal direction of blood flow towards the liver. Other: None. IMPRESSION: No gallstones or ductal dilatation.  Fatty liver infiltration. Electronically Signed   By: Adrianna Horde M.D.   On: 09/23/2023 11:58     Assessment and Plan:   Epigastric pain, chest pain [name] Daugherty describes his chest pain more as epigastric pain that is associated with eating/drinking There are no acute findings found on his CTA chest Troponin negative x 2 Reasonable for GI to proceed with EGD and further workup as they see necessary [name] Daugherty of [facility], can follow-up for possible ischemic evaluation as an outpatient with their practice  Preop evaluation for EGD No active cardiac symptoms (chest pain, dyspnea, syncope, palpitations) No history of recent ACS, decompensated heart failure, significant arrhythmias or severe valvular disease Stable functional status: able to perform > 4 METs without any symptoms RCRI is 0, indicating perioperative risk of major cardiac event is 0.4% No additional cardiac testing is required prior to surgery If new symptoms develop, reassess as  needed  Hypertension Reports compliance with medications at home However presented with blood pressure 232/95 Most recent blood pressure 148/75 Currently receiving hydralazine  IV 10 mg Q8 Home meds: Amlodipine  10 mg daily, carvedilol 6.25 mg twice daily, lisinopril 20 mg daily  Per primary AAA AKI Tobacco abuse     For questions or updates, please contact  [facility] Please consult [email or web address] for contact info under    Signed, Jiles Mote, PA-C  09/23/2023 4:46 PM

## 2023-09-23 NOTE — ED Notes (Signed)
 Patient transported to CT

## 2023-09-23 NOTE — Consult Note (Signed)
 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   Presenting with worsening dysphagia to liquids and solids.   Prior endoscopies with biopsies negative for EoE.  Evaluation for possible esophageal dysmotility was incomplete as patient could not tolerate manometry study ( Care Everywhere)  Chronic chest / epigastric pain associated with dysphagia.  However, he was markedly hypertensive today which may have been contributing to the chest pain.  No acute findings on CT angio of chest. Normal troponin  Abnormal stomach on CT angio Thickening of antrum / pylorus on CT angio.   AAA, 4 cm.   Hypertension, uncontrolled.  Presenting BP was 232/95. Improved after Apresoline   Hypomagnesemia  See PMH for additional history    PLAN:   --Patient will need upper endoscopy for further evaluation .  The risks and benefits of EGD with possible biopsies were discussed with the patient who agrees to proceed.  --We would like to wait on EGD until after Cardiology has evaluated.  --Sips of clears as tolerated , n.p.o. after midnight for possible EGD pending cardiology evaluation -- IV pantoprazole  40 mg daily  --Will fractionate bilirubin after GI -- Magnesium replacement per primary team   DanisHPI   68 y.o. year old male with a medical history including but not limited to chronic GERD /esophagitis /chronic dysphagia/ esophageal diverticula / remote esophageal perforation, HTN, hepatic steatosis  *Video interpreter used for this consultatio she got nauseous and threw up so my best  guess because otherwise she is n  Patient saw his PCP today with complaints of chest pain.  He was sent to the ED for further evaluation of chest pain and markedly elevated BP.   Workup:  In the ED troponin x 2 were normal.  EKG without acute findings.  CT angio of chest without acute findings but does show 4 cm ascending aortic aneurysm.  Labs fairly unremarkable except for creatinine of 1.28.  Total bilirubin 1.3, remainder of LFTs unremarkable.  No acute findings on RUQ ultrasound  Patient has had dysphagia for years. Had bone lodged in esophagus associated with focal esophageal perforation in 2016. Following that in 2022 he saw GI ([facility]) for chronic dysphagia. EGD with biopsies essentially unrevealing. Esophageal manometry attempted but he couldn't tolerate and then lost to follow up.   Dysphagia worse over last couple of weeks. Vomits / regurgitates all PO though he has not had any associated weight loss.  After regurgitating/vomiting he gets epigastric and chest pain which he said has been going on for years but more frequent now. He says he takes an acid blocker every day at home but does not know the names of his medications (Protonix  on home med list).  Reglan  on home med list.     No lower GI complaints but does complain of  abdominal bloating. Does  reports normal BMs. No blood in stool. No FMH of colon cancer. He has never had a colonoscopy   Previous GI Studies   EGD July 2022 - [facility]  2 diverticula were seen in the distal esophagus at 35 cm from the  incisors.   Otherwise normal EGD.  Multiple esophageal biopsies taken from the proximal and distal esophagus  to evaluate for Eosinophilic esophagitis   ESOPHAGUS, DISTAL, BIOPSIES:               Squamous esophageal mucosa with no histopathologic abnormality.               No intraepithelial eosinophils identified.    B.  ESOPHAGUS, PROXIMAL, BIOPSIES:               Squamous esophageal mucosa with no histopathologic abnormality.                No intraepithelial eosinophils identified.   EGD 2016 for dysphagia Focal distal esophageal perforation after removal of a sharp bone from the distal esophagus that was lodged transversely-severe distal esophagitis   Labs and Imaging:  Recent Labs    09/23/23 1111  WBC 8.7  HGB 16.3  HCT 46.9  MCV 88.3  PLT 133*   Recent Labs    09/23/23 1111  NA 139  K 4.1  CL 106  CO2 24  GLUCOSE 92  BUN 11  CREATININE 1.28*  CALCIUM 9.1   Recent Labs    09/23/23 1111  PROT 7.9  ALBUMIN 3.4*  AST 30  ALT 22  ALKPHOS 38  BILITOT 1.3*     CT Angio Chest/Abd/Pel for Dissection W and/or W/WO CLINICAL DATA:  Chest pain. Concern for aortic aneurysm or dissection.  EXAM: CT ANGIOGRAPHY CHEST, ABDOMEN AND PELVIS  TECHNIQUE: Non-contrast CT of the chest was initially obtained.  Multidetector CT imaging through the chest, abdomen and pelvis was performed using the standard protocol during bolus administration of intravenous contrast. Multiplanar reconstructed images and MIPs were obtained and reviewed to evaluate the vascular anatomy.  RADIATION DOSE REDUCTION: This exam was performed according to the departmental dose-optimization program which includes automated exposure control, adjustment of the mA and/or kV according to patient size and/or use of iterative reconstruction technique.  CONTRAST:  80mL OMNIPAQUE  IOHEXOL  350 MG/ML SOLN  COMPARISON:  CT dated 04/28/2017.  FINDINGS: CTA CHEST FINDINGS  Cardiovascular: There is no cardiomegaly or pericardial effusion. Top-normal ascending aorta measures 4 cm in diameter. No aortic dissection. The origins of the great vessels of the aortic arch appear patent. No pulmonary artery embolus identified.  Mediastinum/Nodes: No hilar or mediastinal adenopathy. The esophagus is grossly unremarkable. No mediastinal fluid collection.  Lungs/Pleura: No focal consolidation, pleural effusion, or pneumothorax. The  central airways are patent.  Musculoskeletal: No acute osseous pathology.  Review of the MIP images confirms the above findings.  CTA ABDOMEN AND PELVIS FINDINGS  VASCULAR  Aorta: Mild atherosclerotic calcification. No aneurysmal dilatation or dissection. No periaortic fluid collection.  Celiac: Patent without evidence of aneurysm, dissection, vasculitis or significant stenosis.  SMA: Patent without evidence of aneurysm, dissection, vasculitis or significant stenosis.  Renals: The left renal artery is patent. There is focal area of high-grade narrowing of the proximal right renal artery. The right renal artery remains patent.  IMA: The IMA is patent.  Inflow: The iliac arteries are patent. No aneurysmal dilatation or dissection.  Veins: No obvious venous abnormality within the limitations of this arterial phase study.  Review of the MIP images confirms the above findings.  NON-VASCULAR  No intra-abdominal free air or free fluid.  Hepatobiliary: The liver is unremarkable. No biliary dilatation. The gallbladder is unremarkable  Pancreas: Unremarkable. No pancreatic ductal dilatation or surrounding inflammatory changes.  Spleen: Normal in size without focal abnormality.  Adrenals/Urinary Tract: The adrenal glands unremarkable. The kidneys, visualized ureters, and urinary bladder appear unremarkable.  Stomach/Bowel: There is thickened appearance of the distal stomach involving the gastric antrum and pylorus with mild adjacent stranding which may represent gastritis. There is no bowel obstruction. The appendix is normal.  Lymphatic: No adenopathy.  Reproductive: The prostate and seminal vesicles are grossly unremarkable.  Other: None  Musculoskeletal: No acute or significant osseous findings.  Review of the MIP images confirms the above findings.  IMPRESSION: 1. No acute intrathoracic pathology. Top-normal ascending aorta measures up to 4 cm in diameter.  Recommend annual imaging followup by CTA or MRA. This recommendation follows 2010 ACCF/AHA/AATS/ACR/ASA/SCA/SCAI/SIR/STS/SVM Guidelines for the Diagnosis and Management of Patients with Thoracic Aortic Disease. Circulation. 2010; 121: L875-I433. Aortic aneurysm NOS (ICD10-I71.9) 2. Possible distal gastritis. Clinical correlation is recommended. No bowel obstruction. Normal appendix.  Electronically Signed   By: Angus Bark M.D.   On: 09/23/2023 13:45 DG Chest Port 1 View CLINICAL DATA:  Chest pain.  EXAM: PORTABLE CHEST 1 VIEW  COMPARISON:  Chest radiograph dated 11/11/2018.  FINDINGS: No focal consolidation, pleural effusion, or pneumothorax. Stable cardiac silhouette. No acute osseous pathology.  IMPRESSION: No active disease.  Electronically Signed   By: Angus Bark M.D.   On: 09/23/2023 13:03 US  Abdomen Limited RUQ (LIVER/GB) CLINICAL DATA:  Right upper quadrant pain  EXAM: ULTRASOUND ABDOMEN LIMITED RIGHT UPPER QUADRANT  COMPARISON:  CT 12/20/1916.  FINDINGS: Gallbladder:  No gallstones or wall thickening visualized. No sonographic Murphy sign noted by sonographer.  Common bile duct:  Diameter: 4 mm  Liver:  Diffusely echogenic hepatic parenchyma consistent with fatty liver infiltration. With this level of echogenicity evaluation for underlying mass lesion is limited and if needed follow-up contrast CT or MRI as clinically appropriate. Portal vein is patent on color Doppler imaging with normal direction of blood flow towards the liver.  Other: None.  IMPRESSION: No gallstones or ductal dilatation.  Fatty liver infiltration.  Electronically Signed   By: Adrianna Horde M.D.   On: 09/23/2023 11:58    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   Due to retained foreign body    Hypertension     Past Surgical History:  Procedure Laterality Date   ESOPHAGOGASTRODUODENOSCOPY N/A 08/04/2014   Procedure:  FOREIGN BODY REMOVAL ( ESOPHAGEAL MASS );  Surgeon: Tami Falcon, MD;  Location: [facility] OR;  Service: Endoscopy;  Laterality: N/A;   ESOPHAGOGASTRODUODENOSCOPY Left 08/03/2014   Procedure: ESOPHAGOGASTRODUODENOSCOPY (EGD);  Surgeon: Tami Falcon, MD;  Location: [facility] ENDOSCOPY;  Service: Endoscopy;  Laterality: Left;    Family History  Problem Relation Age of Onset   Sudden Cardiac Death Neg Hx     Prior to Admission medications   Medication Sig Start Date End Date Taking? Authorizing Provider  amLODipine  (NORVASC ) 5 MG tablet Take 5 mg by mouth daily.    [provider]  amLODipine  (NORVASC ) 5 MG tablet Take 1 tablet (5 mg total) by mouth daily. 09/11/18   Dodie Frees, MD  metoCLOPramide  (REGLAN ) 5 MG tablet Take 5 mg by mouth 3 (three) times daily before meals.    [provider]  metoprolol  tartrate (LOPRESSOR ) 25  MG tablet Take 0.5 tablets (12.5 mg total) by mouth 2 (two) times daily. 04/29/17   Chere Cordon, MD  pantoprazole  (PROTONIX ) 40 MG tablet TAKE 40MG  BY MOUTH DAILY AS NEEDED FOR ACID REFLUX 20 MINUTES BEFORE BREAKFAST 05/30/15   [provider]    Current Facility-Administered Medications  Medication Dose Route Frequency Provider Last Rate Last Admin   hydrALAZINE  (APRESOLINE ) injection 10 mg  10 mg Intravenous Q4H PRN Patel, Ekta V, MD       pantoprazole  (PROTONIX ) injection 40 mg  40 mg Intravenous Q12H Patel, Ekta V, MD       sucralfate  (CARAFATE ) 1 GM/10ML suspension 1 g  1 g Oral TID WC & HS Lavanda Porter, MD       Current Outpatient Medications  Medication Sig Dispense Refill   amLODipine  (NORVASC ) 5 MG tablet Take 5 mg by mouth daily.     amLODipine  (NORVASC ) 5 MG tablet Take 1 tablet (5 mg total) by mouth daily. 30 tablet 1   metoCLOPramide  (REGLAN ) 5 MG tablet Take 5 mg by mouth 3 (three) times daily before meals.     metoprolol  tartrate (LOPRESSOR ) 25 MG tablet Take 0.5 tablets (12.5 mg total) by mouth 2 (two) times daily. 30 tablet 0    pantoprazole  (PROTONIX ) 40 MG tablet TAKE 40MG  BY MOUTH DAILY AS NEEDED FOR ACID REFLUX 20 MINUTES BEFORE BREAKFAST  12    Allergies as of 09/23/2023   (No Known Allergies)    Social History   Socioeconomic History   Marital status: Married    Spouse name: Not on file   Number of children: Not on file   Years of education: Not on file   Highest education level: Not on file  Occupational History   Not on file  Tobacco Use   Smoking status: Some Days   Smokeless tobacco: Current  Substance and Sexual Activity   Alcohol use: Yes    Comment: social   Drug use: No   Sexual activity: Not on file  Other Topics Concern   Not on file  Social History Narrative   Not on file   Social Drivers of Health   Financial Resource Strain: Not on file  Food Insecurity: Not on file  Transportation Needs: Not on file  Physical Activity: Not on file  Stress: Not on file  Social Connections: Not on file  Intimate Partner Violence: Not on file     Code Status   Code Status: Prior  Review of Systems: All systems reviewed and negative except where noted in HPI.  Physical Exam: Vital signs in last 24 hours: Temp:  [97.9 F (36.6 C)-98 F (36.7 C)] 97.9 F (36.6 C) (05/15 1221) Pulse Rate:  [51-69] 59 (05/15 1245) Resp:  [14-19] 19 (05/15 1245) BP: (156-232)/(81-102) 175/89 (05/15 1245) SpO2:  [97 %-100 %] 98 % (05/15 1245) Weight:  [72.6 kg] 72.6 kg (05/15 1052)    General:  Pleasant male in NAD Psych:  Cooperative. Normal mood and affect Eyes: Pupils equal Ears:  Normal auditory acuity Nose: No deformity, discharge or lesions Neck:  Supple, no masses felt Lungs:  Clear to auscultation.  Heart:  Regular rate, regular rhythm.  Abdomen:  Soft, nondistended, mild-moderate epigastric tenderness. Active bowel sounds, no masses felt Rectal :  Deferred Msk: Symmetrical without gross deformities.  Neurologic:  Alert, oriented, grossly normal neurologically Extremities : No  edema Skin:  Intact without significant lesions.    Intake/Output from previous day: No intake/output data recorded. Intake/Output  this shift:  No intake/output data recorded.   Mai Schwalbe, NP-C   09/23/2023, 2:45 PM

## 2023-09-23 NOTE — Progress Notes (Addendum)
 Report received from ED Nurse caring for Pt. Waiting to receive pt and assume care.  Pt granddaughter at bedside for shift assessment.

## 2023-09-23 NOTE — ED Notes (Signed)
 Help patient get into on the monitor did EKG shown to er provider patient is resting with call bell in reach

## 2023-09-23 NOTE — ED Triage Notes (Signed)
 Patient arrives from [facility] from family practice, cp starting this morning substernal non radiating. Diagnosed with flu last week, NV recently. Vomiting making pain worse, different than pain before.  220/120 has taken antihypertensive today.  180/100. Alert and oriented\ HR 64 96 on room air CBG 80  20 LAC. 324 ASA, 2 nitroglycerin - with some relief.   Ascending aortic aneurysm hx.   Headache and throat pain, feelings like something is stuck in throat.

## 2023-09-23 NOTE — ED Provider Notes (Signed)
 [place] EMERGENCY DEPARTMENT AT [facility] Provider Note   CSN: [id number] Arrival date & time: 09/23/23  1049     History  Chief Complaint  Patient presents with   Chest Pain   HPI [name] Daugherty is a 68 y.o. male with hypertension, AAA presenting for chest pain.  Has been going on for about a week.  Pain is in the center of the chest and nonradiating otherwise.  Feels like a sharp pain.  Endorses intermittent shortness of breath.  Also was diagnosed with the flu last week.  Reports 10 days of vomiting but no diarrhea.  Was seen earlier PCP in both pressure was found to be 220/120.  Sent here for further evaluation.  Did take his blood pressure medications today.  Was given aspirin  and nitroglycerin  x 2 and reported some relief.   Chest Pain      Home Medications Prior to Admission medications   Medication Sig Start Date End Date Taking? Authorizing Provider  amLODipine  (NORVASC ) 10 MG tablet Take 10 mg by mouth daily. 07/22/23   [provider]  aspirin  EC 81 MG tablet Take 1 tablet by mouth daily. 07/05/23 07/04/24  [provider]  carvedilol (COREG) 6.25 MG tablet Take 6.25 mg by mouth 2 (two) times daily with a meal. 07/05/23 07/04/24  [provider]  lisinopril (ZESTRIL) 20 MG tablet Take 20 mg by mouth daily. 07/22/23   [provider]  metoCLOPramide  (REGLAN ) 5 MG tablet Take 5 mg by mouth 3 (three) times daily before meals.    [provider]  pantoprazole  (PROTONIX ) 40 MG tablet Take 40 mg by mouth daily as needed (acid reflux 20 minutes before breakfast). 05/30/15   [provider]      Allergies    Patient has no known allergies.    Review of Systems   Review of Systems  Cardiovascular:  Positive for chest pain.    Physical Exam Updated Vital Signs BP (!) 175/89   Pulse (!) 59   Temp 97.9 F (36.6 C) (Oral)   Resp 19   Ht 5' 0.5" (1.537 m)   Wt 72.6 kg   SpO2 98%   BMI 30.74 kg/m  Physical  Exam Vitals and nursing note reviewed.  HENT:     Head: Normocephalic and atraumatic.     Mouth/Throat:     Mouth: Mucous membranes are moist.  Eyes:     General:        Right eye: No discharge.        Left eye: No discharge.     Conjunctiva/sclera: Conjunctivae normal.  Cardiovascular:     Rate and Rhythm: Normal rate and regular rhythm.     Pulses: Normal pulses.          Radial pulses are 2+ on the right side and 2+ on the left side.       Dorsalis pedis pulses are 2+ on the right side and 2+ on the left side.     Heart sounds: Normal heart sounds.  Pulmonary:     Effort: Pulmonary effort is normal.     Breath sounds: Normal breath sounds.  Abdominal:     General: Abdomen is flat.     Palpations: Abdomen is soft.  Skin:    General: Skin is warm and dry.  Neurological:     General: No focal deficit present.  Psychiatric:        Mood and Affect: Mood normal.     ED  Results / Procedures / Treatments   Labs (all labs ordered are listed, but only abnormal results are displayed) Labs Reviewed  CBC WITH DIFFERENTIAL/PLATELET - Abnormal; Notable for the following components:      Result Value   Platelets 133 (*)    All other components within normal limits  COMPREHENSIVE METABOLIC PANEL WITH GFR - Abnormal; Notable for the following components:   Creatinine, Ser 1.28 (*)    Albumin 3.4 (*)    Total Bilirubin 1.3 (*)    All other components within normal limits  LIPASE, BLOOD  URINALYSIS, ROUTINE W REFLEX MICROSCOPIC  MAGNESIUM  ETHANOL  HIV ANTIBODY (ROUTINE TESTING W REFLEX)  HEMOGLOBIN A1C  T4, FREE  TSH  CBC  CREATININE, SERUM  TROPONIN I (HIGH SENSITIVITY)  TROPONIN I (HIGH SENSITIVITY)    EKG EKG Interpretation Date/Time:  Thursday Sep 23 2023 10:51:37 EDT Ventricular Rate:  68 PR Interval:  169 QRS Duration:  102 QT Interval:  409 QTC Calculation: 435 R Axis:   67  Text Interpretation: Sinus rhythm Probable left ventricular hypertrophy when  compared top rior, similar appearnce No STEMI Confirmed by Wynell Heath ([id number]) on 09/23/2023 10:52:18 AM  Radiology CT Angio Chest/Abd/Pel for Dissection W and/or W/WO Result Date: 09/23/2023 CLINICAL DATA:  Chest pain. Concern for aortic aneurysm or dissection. EXAM: CT ANGIOGRAPHY CHEST, ABDOMEN AND PELVIS TECHNIQUE: Non-contrast CT of the chest was initially obtained. Multidetector CT imaging through the chest, abdomen and pelvis was performed using the standard protocol during bolus administration of intravenous contrast. Multiplanar reconstructed images and MIPs were obtained and reviewed to evaluate the vascular anatomy. RADIATION DOSE REDUCTION: This exam was performed according to the departmental dose-optimization program which includes automated exposure control, adjustment of the mA and/or kV according to patient size and/or use of iterative reconstruction technique. CONTRAST:  80mL OMNIPAQUE  IOHEXOL  350 MG/ML SOLN COMPARISON:  CT dated 04/28/2017. FINDINGS: CTA CHEST FINDINGS Cardiovascular: There is no cardiomegaly or pericardial effusion. Top-normal ascending aorta measures 4 cm in diameter. No aortic dissection. The origins of the great vessels of the aortic arch appear patent. No pulmonary artery embolus identified. Mediastinum/Nodes: No hilar or mediastinal adenopathy. The esophagus is grossly unremarkable. No mediastinal fluid collection. Lungs/Pleura: No focal consolidation, pleural effusion, or pneumothorax. The central airways are patent. Musculoskeletal: No acute osseous pathology. Review of the MIP images confirms the above findings. CTA ABDOMEN AND PELVIS FINDINGS VASCULAR Aorta: Mild atherosclerotic calcification. No aneurysmal dilatation or dissection. No periaortic fluid collection. Celiac: Patent without evidence of aneurysm, dissection, vasculitis or significant stenosis. SMA: Patent without evidence of aneurysm, dissection, vasculitis or significant stenosis. Renals: The left  renal artery is patent. There is focal area of high-grade narrowing of the proximal right renal artery. The right renal artery remains patent. IMA: The IMA is patent. Inflow: The iliac arteries are patent. No aneurysmal dilatation or dissection. Veins: No obvious venous abnormality within the limitations of this arterial phase study. Review of the MIP images confirms the above findings. NON-VASCULAR No intra-abdominal free air or free fluid. Hepatobiliary: The liver is unremarkable. No biliary dilatation. The gallbladder is unremarkable Pancreas: Unremarkable. No pancreatic ductal dilatation or surrounding inflammatory changes. Spleen: Normal in size without focal abnormality. Adrenals/Urinary Tract: The adrenal glands unremarkable. The kidneys, visualized ureters, and urinary bladder appear unremarkable. Stomach/Bowel: There is thickened appearance of the distal stomach involving the gastric antrum and pylorus with mild adjacent stranding which may represent gastritis. There is no bowel obstruction. The appendix is normal. Lymphatic: No adenopathy. Reproductive:  The prostate and seminal vesicles are grossly unremarkable. Other: None Musculoskeletal: No acute or significant osseous findings. Review of the MIP images confirms the above findings. IMPRESSION: 1. No acute intrathoracic pathology. Top-normal ascending aorta measures up to 4 cm in diameter. Recommend annual imaging followup by CTA or MRA. This recommendation follows 2010 ACCF/AHA/AATS/ACR/ASA/SCA/SCAI/SIR/STS/SVM Guidelines for the Diagnosis and Management of Patients with Thoracic Aortic Disease. Circulation. 2010; 121: Y403-K742. Aortic aneurysm NOS (ICD10-I71.9) 2. Possible distal gastritis. Clinical correlation is recommended. No bowel obstruction. Normal appendix. Electronically Signed   By: Angus Bark M.D.   On: 09/23/2023 13:45   DG Chest Port 1 View Result Date: 09/23/2023 CLINICAL DATA:  Chest pain. EXAM: PORTABLE CHEST 1 VIEW  COMPARISON:  Chest radiograph dated 11/11/2018. FINDINGS: No focal consolidation, pleural effusion, or pneumothorax. Stable cardiac silhouette. No acute osseous pathology. IMPRESSION: No active disease. Electronically Signed   By: Angus Bark M.D.   On: 09/23/2023 13:03   US  Abdomen Limited RUQ (LIVER/GB) Result Date: 09/23/2023 CLINICAL DATA:  Right upper quadrant pain EXAM: ULTRASOUND ABDOMEN LIMITED RIGHT UPPER QUADRANT COMPARISON:  CT 12/20/1916. FINDINGS: Gallbladder: No gallstones or wall thickening visualized. No sonographic Murphy sign noted by sonographer. Common bile duct: Diameter: 4 mm Liver: Diffusely echogenic hepatic parenchyma consistent with fatty liver infiltration. With this level of echogenicity evaluation for underlying mass lesion is limited and if needed follow-up contrast CT or MRI as clinically appropriate. Portal vein is patent on color Doppler imaging with normal direction of blood flow towards the liver. Other: None. IMPRESSION: No gallstones or ductal dilatation.  Fatty liver infiltration. Electronically Signed   By: Adrianna Horde M.D.   On: 09/23/2023 11:58    Procedures .Critical Care  Performed by: Janalee Mcmurray, PA-C Authorized by: Janalee Mcmurray, PA-C   Critical care provider statement:    Critical care time (minutes):  30   Critical care was necessary to treat or prevent imminent or life-threatening deterioration of the following conditions: HTN emergency.   Critical care was time spent personally by me on the following activities:  Development of treatment plan with patient or surrogate, discussions with consultants, evaluation of patient's response to treatment, examination of patient, ordering and review of laboratory studies, ordering and review of radiographic studies, ordering and performing treatments and interventions, pulse oximetry, re-evaluation of patient's condition and review of old charts     Medications Ordered in ED Medications   pantoprazole  (PROTONIX ) injection 40 mg (has no administration in time range)  sucralfate  (CARAFATE ) 1 GM/10ML suspension 1 g (has no administration in time range)  acetaminophen  (TYLENOL ) tablet 650 mg (has no administration in time range)  ondansetron  (ZOFRAN ) injection 4 mg (has no administration in time range)  heparin  injection 5,000 Units (has no administration in time range)  lactated ringers  infusion (has no administration in time range)  hydrALAZINE  (APRESOLINE ) injection 5 mg (has no administration in time range)  hydrALAZINE  (APRESOLINE ) injection 10 mg (has no administration in time range)  nitroGLYCERIN  (NITROSTAT ) SL tablet 0.4 mg (has no administration in time range)  ondansetron  (ZOFRAN ) injection 4 mg (4 mg Intravenous Given 09/23/23 1154)  morphine  (PF) 4 MG/ML injection 4 mg (4 mg Intravenous Given 09/23/23 1155)  iohexol  (OMNIPAQUE ) 350 MG/ML injection 80 mL (80 mLs Intravenous Contrast Given 09/23/23 1330)  hydrALAZINE  (APRESOLINE ) injection 10 mg (10 mg Intravenous Given 09/23/23 1430)    ED Course/ Medical Decision Making/ A&P Clinical Course as of 09/23/23 1544  Thu Sep 23, 2023  1419 BP(!): 175/89 [  JR]    Clinical Course User Index [JR] Parsa Rickett K, PA-C                                 Medical Decision Making Amount and/or Complexity of Data Reviewed Labs: ordered. Radiology: ordered.  Risk Prescription drug management. Decision regarding hospitalization.   Initial Impression and Ddx 68 year old well-appearing male presenting for chest pain.  Exam was unremarkable but initial blood pressure was greater than 230.  DDx includes hypertensive emergency, aortic dissection, ACS, PE, other. Patient PMH that increases complexity of ED encounter:  HTN, AAA  Interpretation of Diagnostics I independent reviewed and interpreted the labs as followed: Elevated creatinine  - I independently visualized the following imaging with scope of interpretation limited to  determining acute life threatening conditions related to emergency care: CXR, which revealed no active disease, CT angio chest/ab/pelvis no acute intrathoracic pathology but evidence of gastritis, right upper quadrant ultrasound was unremarkable  -I personally reviewed and interpreted EKG which revealed sinus rhythm and otherwise nonischemic  Patient Reassessment and Ultimate Disposition/Management On assessment, patient stated that chest pain had improved but still present.  Given the notably elevated blood pressure during initial encounter and at PCP office with along with chest pain admitted for hypertensive emergency with Dr. Brunilda Capra.  Blood pressure did notably start to rise as well prompting treatment with IV hydralazine  per hospitalist request.  Patient management required discussion with the following services or consulting groups:  Hospitalist Service  Complexity of Problems Addressed Acute complicated illness or Injury  Additional Data Reviewed and Analyzed Further history obtained from: Further history from spouse/family member, Past medical history and medications listed in the EMR, and Prior ED visit notes  Patient Encounter Risk Assessment Consideration of hospitalization         Final Clinical Impression(s) / ED Diagnoses Final diagnoses:  Chest pain, unspecified type    Rx / DC Orders ED Discharge Orders     None         Janalee Mcmurray, PA-C 09/23/23 1544    Tegeler, Marine Sia, MD 09/23/23 [id number]

## 2023-09-24 ENCOUNTER — Encounter (HOSPITAL_COMMUNITY)
Admission: EM | Disposition: A | Discharge status: Home / Self Care | Payer: Self-pay | Source: Home / Self Care | Attending: Student

## 2023-09-24 ENCOUNTER — Observation Stay (HOSPITAL_COMMUNITY): Admitting: Certified Registered"

## 2023-09-24 DIAGNOSIS — R0789 Other chest pain: Secondary | ICD-10-CM | POA: Diagnosis not present

## 2023-09-24 DIAGNOSIS — I7121 Aneurysm of the ascending aorta, without rupture: Secondary | ICD-10-CM

## 2023-09-24 DIAGNOSIS — F17210 Nicotine dependence, cigarettes, uncomplicated: Secondary | ICD-10-CM | POA: Diagnosis not present

## 2023-09-24 DIAGNOSIS — Z6830 Body mass index (BMI) 30.0-30.9, adult: Secondary | ICD-10-CM | POA: Diagnosis not present

## 2023-09-24 DIAGNOSIS — I700 Atherosclerosis of aorta: Secondary | ICD-10-CM | POA: Diagnosis not present

## 2023-09-24 DIAGNOSIS — K222 Esophageal obstruction: Secondary | ICD-10-CM

## 2023-09-24 DIAGNOSIS — I161 Hypertensive emergency: Secondary | ICD-10-CM | POA: Diagnosis present

## 2023-09-24 DIAGNOSIS — Z7982 Long term (current) use of aspirin: Secondary | ICD-10-CM | POA: Diagnosis not present

## 2023-09-24 DIAGNOSIS — K223 Perforation of esophagus: Secondary | ICD-10-CM

## 2023-09-24 DIAGNOSIS — K219 Gastro-esophageal reflux disease without esophagitis: Secondary | ICD-10-CM | POA: Diagnosis present

## 2023-09-24 DIAGNOSIS — R17 Unspecified jaundice: Secondary | ICD-10-CM | POA: Diagnosis present

## 2023-09-24 DIAGNOSIS — I129 Hypertensive chronic kidney disease with stage 1 through stage 4 chronic kidney disease, or unspecified chronic kidney disease: Secondary | ICD-10-CM | POA: Diagnosis present

## 2023-09-24 DIAGNOSIS — I10 Essential (primary) hypertension: Secondary | ICD-10-CM | POA: Diagnosis not present

## 2023-09-24 DIAGNOSIS — Z720 Tobacco use: Secondary | ICD-10-CM | POA: Diagnosis not present

## 2023-09-24 DIAGNOSIS — E66811 Obesity, class 1: Secondary | ICD-10-CM | POA: Diagnosis present

## 2023-09-24 DIAGNOSIS — R1319 Other dysphagia: Secondary | ICD-10-CM | POA: Diagnosis not present

## 2023-09-24 DIAGNOSIS — Z79899 Other long term (current) drug therapy: Secondary | ICD-10-CM | POA: Diagnosis not present

## 2023-09-24 DIAGNOSIS — R112 Nausea with vomiting, unspecified: Secondary | ICD-10-CM | POA: Diagnosis not present

## 2023-09-24 DIAGNOSIS — K2289 Other specified disease of esophagus: Secondary | ICD-10-CM | POA: Diagnosis not present

## 2023-09-24 DIAGNOSIS — R1310 Dysphagia, unspecified: Secondary | ICD-10-CM | POA: Diagnosis not present

## 2023-09-24 DIAGNOSIS — Z603 Acculturation difficulty: Secondary | ICD-10-CM | POA: Diagnosis present

## 2023-09-24 DIAGNOSIS — K2950 Unspecified chronic gastritis without bleeding: Secondary | ICD-10-CM

## 2023-09-24 DIAGNOSIS — K2970 Gastritis, unspecified, without bleeding: Secondary | ICD-10-CM | POA: Diagnosis present

## 2023-09-24 DIAGNOSIS — R1314 Dysphagia, pharyngoesophageal phase: Secondary | ICD-10-CM | POA: Diagnosis present

## 2023-09-24 DIAGNOSIS — I160 Hypertensive urgency: Secondary | ICD-10-CM | POA: Diagnosis not present

## 2023-09-24 DIAGNOSIS — E785 Hyperlipidemia, unspecified: Secondary | ICD-10-CM | POA: Diagnosis present

## 2023-09-24 DIAGNOSIS — N182 Chronic kidney disease, stage 2 (mild): Secondary | ICD-10-CM | POA: Diagnosis present

## 2023-09-24 DIAGNOSIS — R079 Chest pain, unspecified: Secondary | ICD-10-CM | POA: Diagnosis present

## 2023-09-24 HISTORY — PX: ESOPHAGOGASTRODUODENOSCOPY: SHX5428

## 2023-09-24 HISTORY — PX: BIOPSY OF SKIN SUBCUTANEOUS TISSUE AND/OR MUCOUS MEMBRANE: SHX6741

## 2023-09-24 LAB — CBC
HCT: 43.8 % (ref 39.0–52.0)
Hemoglobin: 15.1 g/dL (ref 13.0–17.0)
MCH: 30.1 pg (ref 26.0–34.0)
MCHC: 34.5 g/dL (ref 30.0–36.0)
MCV: 87.4 fL (ref 80.0–100.0)
Platelets: 103 10*3/uL — ABNORMAL LOW (ref 150–400)
RBC: 5.01 MIL/uL (ref 4.22–5.81)
RDW: 14.6 % (ref 11.5–15.5)
WBC: 8.6 10*3/uL (ref 4.0–10.5)
nRBC: 0 % (ref 0.0–0.2)

## 2023-09-24 LAB — URINALYSIS, ROUTINE W REFLEX MICROSCOPIC
Bacteria, UA: NONE SEEN
Bilirubin Urine: NEGATIVE
Glucose, UA: NEGATIVE mg/dL
Hgb urine dipstick: NEGATIVE
Ketones, ur: NEGATIVE mg/dL
Leukocytes,Ua: NEGATIVE
Nitrite: NEGATIVE
Protein, ur: NEGATIVE mg/dL
Specific Gravity, Urine: 1.017 (ref 1.005–1.030)
pH: 6 (ref 5.0–8.0)

## 2023-09-24 LAB — RENAL FUNCTION PANEL
Albumin: 3 g/dL — ABNORMAL LOW (ref 3.5–5.0)
Anion gap: 12 (ref 5–15)
BUN: 15 mg/dL (ref 8–23)
CO2: 20 mmol/L — ABNORMAL LOW (ref 22–32)
Calcium: 8.8 mg/dL — ABNORMAL LOW (ref 8.9–10.3)
Chloride: 105 mmol/L (ref 98–111)
Creatinine, Ser: 1.35 mg/dL — ABNORMAL HIGH (ref 0.61–1.24)
GFR, Estimated: 58 mL/min — ABNORMAL LOW (ref >60)
Glucose, Bld: 79 mg/dL (ref 70–99)
Phosphorus: 3.3 mg/dL (ref 2.5–4.6)
Potassium: 4.2 mmol/L (ref 3.5–5.1)
Sodium: 137 mmol/L (ref 135–145)

## 2023-09-24 LAB — HEPATIC FUNCTION PANEL
ALT: 20 U/L (ref 0–44)
AST: 26 U/L (ref 15–41)
Albumin: 3 g/dL — ABNORMAL LOW (ref 3.5–5.0)
Alkaline Phosphatase: 46 U/L (ref 38–126)
Bilirubin, Direct: 0.3 mg/dL — ABNORMAL HIGH (ref 0.0–0.2)
Indirect Bilirubin: 1.5 mg/dL — ABNORMAL HIGH (ref 0.3–0.9)
Total Bilirubin: 1.8 mg/dL — ABNORMAL HIGH (ref 0.0–1.2)
Total Protein: 7.1 g/dL (ref 6.5–8.1)

## 2023-09-24 LAB — MAGNESIUM: Magnesium: 2 mg/dL (ref 1.7–2.4)

## 2023-09-24 SURGERY — EGD (ESOPHAGOGASTRODUODENOSCOPY)
Anesthesia: Monitor Anesthesia Care

## 2023-09-24 MED ORDER — PROPOFOL 10 MG/ML IV BOLUS
INTRAVENOUS | Status: DC | PRN
  Administered 2023-09-24: 30 mg via INTRAVENOUS
  Administered 2023-09-24: 20 mg via INTRAVENOUS
  Administered 2023-09-24: 30 mg via INTRAVENOUS

## 2023-09-24 MED ORDER — PROPOFOL 500 MG/50ML IV EMUL
INTRAVENOUS | Status: DC | PRN
  Administered 2023-09-24: 145 ug/kg/min via INTRAVENOUS

## 2023-09-24 MED ORDER — LIDOCAINE 2% (20 MG/ML) 5 ML SYRINGE
INTRAMUSCULAR | Status: DC | PRN
  Administered 2023-09-24: 80 mg via INTRAVENOUS

## 2023-09-24 MED ORDER — SODIUM CHLORIDE 0.9 % IV SOLN: INTRAVENOUS | Status: DC | PRN

## 2023-09-24 NOTE — Progress Notes (Signed)
 Rounding Note    Patient Name: [name] Daugherty Date of Encounter: 09/24/2023  [facility] Cardiologist: None   Subjective   Feeling better.  Still lower chest wall tenderness.  No exertional chest pain or shortness of breath.   Inpatient Medications    Scheduled Meds:  heparin   5,000 Units Subcutaneous Q8H   hydrALAZINE   10 mg Intravenous Q8H   mupirocin ointment  1 Application Nasal BID   pantoprazole  (PROTONIX ) IV  40 mg Intravenous Q12H   sucralfate   1 g Oral TID WC & HS   Continuous Infusions:  lactated ringers  75 mL/hr at 09/24/23 0800   PRN Meds: acetaminophen , hydrALAZINE , nitroGLYCERIN , ondansetron  (ZOFRAN ) IV   Vital Signs    Vitals:   09/23/23 2100 09/23/23 2355 09/24/23 0520 09/24/23 0728  BP: (!) 163/113 (!) 143/84 (!) 146/89 (!) 148/81  Pulse:  86 85 91  Resp:  16 17 15   Temp: 97.8 F (36.6 C) 98.3 F (36.8 C) 98.8 F (37.1 C) 98.9 F (37.2 C)  TempSrc: Oral Oral Oral Oral  SpO2:  94% 95% 95%  Weight:      Height:        Intake/Output Summary (Last 24 hours) at 09/24/2023 0952 Last data filed at 09/24/2023 0800 Gross per 24 hour  Intake 1132.05 ml  Output 300 ml  Net 832.05 ml      09/23/2023   10:52 AM 09/11/2018    3:10 PM 04/29/2017    5:05 AM  Last 3 Weights  Weight (lbs) 160 lb 0.9 oz 160 lb 156 lb 12.8 oz  Weight (kg) 72.6 kg 72.576 kg 71.124 kg      Telemetry    Sinus rhythm.  No events.  - Personally Reviewed  ECG    Sinus rhythm.  Rate 68 bpm.  LVH - Personally Reviewed  Physical Exam   VS:  BP (!) 148/81 (BP Location: Right Arm)   Pulse 91   Temp 98.9 F (37.2 C) (Oral)   Resp 15   Ht 5' 0.5" (1.537 m)   Wt 72.6 kg   SpO2 95%   BMI 30.74 kg/m  , BMI Body mass index is 30.74 kg/m. GENERAL:  Well appearing HEENT: Pupils equal round and reactive, fundi not visualized, oral mucosa unremarkable NECK:  No jugular venous distention, waveform within normal limits, carotid upstroke brisk and symmetric, no bruits,  no thyromegaly LUNGS:  Clear to auscultation bilaterally HEART:  RRR.  PMI not displaced or sustained,S1 and S2 within normal limits, no S3, no S4, no clicks, no rubs, no murmurs ABD:  Flat, positive bowel sounds normal in frequency in pitch, no bruits, no rebound, no guarding, no midline pulsatile mass, no hepatomegaly, no splenomegaly EXT:  2 plus pulses throughout, no edema, no cyanosis no clubbing SKIN:  No rashes no nodules NEURO:  Cranial nerves II through XII grossly intact, motor grossly intact throughout PSYCH:  Cognitively intact, oriented to person place and time   Labs    High Sensitivity Troponin:   Recent Labs  Lab 09/23/23 1111 09/23/23 1429  TROPONINIHS 10 5     Chemistry Recent Labs  Lab 09/23/23 1111 09/23/23 1429 09/23/23 1521  NA 139  --   --   K 4.1  --   --   CL 106  --   --   CO2 24  --   --   GLUCOSE 92  --   --   BUN 11  --   --  CREATININE 1.28*  --  1.25*  CALCIUM 9.1  --   --   MG  --  1.4*  --   PROT 7.9  --   --   ALBUMIN 3.4*  --   --   AST 30  --   --   ALT 22  --   --   ALKPHOS 38  --   --   BILITOT 1.3*  --  1.8*  GFRNONAA >60  --  >60  ANIONGAP 9  --   --     Lipids No results for input(s): "CHOL", "TRIG", "HDL", "LABVLDL", "LDLCALC", "CHOLHDL" in the last 168 hours.  Hematology Recent Labs  Lab 09/23/23 1111 09/23/23 1521  WBC 8.7 8.7  RBC 5.31 5.20  HGB 16.3 16.0  HCT 46.9 46.2  MCV 88.3 88.8  MCH 30.7 30.8  MCHC 34.8 34.6  RDW 14.2 14.2  PLT 133* 130*   Thyroid  Recent Labs  Lab 09/23/23 1521  TSH 1.275  FREET4 0.75    BNPNo results for input(s): "BNP", "PROBNP" in the last 168 hours.  DDimer No results for input(s): "DDIMER" in the last 168 hours.   Radiology    CT Angio Chest/Abd/Pel for Dissection W and/or W/WO Result Date: 09/23/2023 CLINICAL DATA:  Chest pain. Concern for aortic aneurysm or dissection. EXAM: CT ANGIOGRAPHY CHEST, ABDOMEN AND PELVIS TECHNIQUE: Non-contrast CT of the chest was initially  obtained. Multidetector CT imaging through the chest, abdomen and pelvis was performed using the standard protocol during bolus administration of intravenous contrast. Multiplanar reconstructed images and MIPs were obtained and reviewed to evaluate the vascular anatomy. RADIATION DOSE REDUCTION: This exam was performed according to the departmental dose-optimization program which includes automated exposure control, adjustment of the mA and/or kV according to patient size and/or use of iterative reconstruction technique. CONTRAST:  80mL OMNIPAQUE  IOHEXOL  350 MG/ML SOLN COMPARISON:  CT dated 04/28/2017. FINDINGS: CTA CHEST FINDINGS Cardiovascular: There is no cardiomegaly or pericardial effusion. Top-normal ascending aorta measures 4 cm in diameter. No aortic dissection. The origins of the great vessels of the aortic arch appear patent. No pulmonary artery embolus identified. Mediastinum/Nodes: No hilar or mediastinal adenopathy. The esophagus is grossly unremarkable. No mediastinal fluid collection. Lungs/Pleura: No focal consolidation, pleural effusion, or pneumothorax. The central airways are patent. Musculoskeletal: No acute osseous pathology. Review of the MIP images confirms the above findings. CTA ABDOMEN AND PELVIS FINDINGS VASCULAR Aorta: Mild atherosclerotic calcification. No aneurysmal dilatation or dissection. No periaortic fluid collection. Celiac: Patent without evidence of aneurysm, dissection, vasculitis or significant stenosis. SMA: Patent without evidence of aneurysm, dissection, vasculitis or significant stenosis. Renals: The left renal artery is patent. There is focal area of high-grade narrowing of the proximal right renal artery. The right renal artery remains patent. IMA: The IMA is patent. Inflow: The iliac arteries are patent. No aneurysmal dilatation or dissection. Veins: No obvious venous abnormality within the limitations of this arterial phase study. Review of the MIP images confirms the  above findings. NON-VASCULAR No intra-abdominal free air or free fluid. Hepatobiliary: The liver is unremarkable. No biliary dilatation. The gallbladder is unremarkable Pancreas: Unremarkable. No pancreatic ductal dilatation or surrounding inflammatory changes. Spleen: Normal in size without focal abnormality. Adrenals/Urinary Tract: The adrenal glands unremarkable. The kidneys, visualized ureters, and urinary bladder appear unremarkable. Stomach/Bowel: There is thickened appearance of the distal stomach involving the gastric antrum and pylorus with mild adjacent stranding which may represent gastritis. There is no bowel obstruction. The appendix is normal. Lymphatic: No  adenopathy. Reproductive: The prostate and seminal vesicles are grossly unremarkable. Other: None Musculoskeletal: No acute or significant osseous findings. Review of the MIP images confirms the above findings. IMPRESSION: 1. No acute intrathoracic pathology. Top-normal ascending aorta measures up to 4 cm in diameter. Recommend annual imaging followup by CTA or MRA. This recommendation follows 2010 ACCF/AHA/AATS/ACR/ASA/SCA/SCAI/SIR/STS/SVM Guidelines for the Diagnosis and Management of Patients with Thoracic Aortic Disease. Circulation. 2010; 121: R604-V409. Aortic aneurysm NOS (ICD10-I71.9) 2. Possible distal gastritis. Clinical correlation is recommended. No bowel obstruction. Normal appendix. Electronically Signed   By: Angus Bark M.D.   On: 09/23/2023 13:45   DG Chest Port 1 View Result Date: 09/23/2023 CLINICAL DATA:  Chest pain. EXAM: PORTABLE CHEST 1 VIEW COMPARISON:  Chest radiograph dated 11/11/2018. FINDINGS: No focal consolidation, pleural effusion, or pneumothorax. Stable cardiac silhouette. No acute osseous pathology. IMPRESSION: No active disease. Electronically Signed   By: Angus Bark M.D.   On: 09/23/2023 13:03   US  Abdomen Limited RUQ (LIVER/GB) Result Date: 09/23/2023 CLINICAL DATA:  Right upper quadrant pain  EXAM: ULTRASOUND ABDOMEN LIMITED RIGHT UPPER QUADRANT COMPARISON:  CT 12/20/1916. FINDINGS: Gallbladder: No gallstones or wall thickening visualized. No sonographic Murphy sign noted by sonographer. Common bile duct: Diameter: 4 mm Liver: Diffusely echogenic hepatic parenchyma consistent with fatty liver infiltration. With this level of echogenicity evaluation for underlying mass lesion is limited and if needed follow-up contrast CT or MRI as clinically appropriate. Portal vein is patent on color Doppler imaging with normal direction of blood flow towards the liver. Other: None. IMPRESSION: No gallstones or ductal dilatation.  Fatty liver infiltration. Electronically Signed   By: Adrianna Horde M.D.   On: 09/23/2023 11:58    Cardiac Studies   Echo 05/25/23; SUMMARY  The left ventricular size is normal.  There is normal left ventricular wall thickness.  Left ventricular systolic function is normal.  LV ejection fraction = 55-60%.  The left ventricular wall motion is normal.  There is mild aortic regurgitation.  No pulmonary hypertension.  Mildly dilated ascending aorta.  - Ascending aorta measures 4.1cm.  - Aortic root measures 3.8 cm.  No significant change compared to the prior study.   Patient Profile     Mr. Friedli is a 73M with hypertension, ascending aorta aneurysm, and prior esophageal perforation admitted with chest pain.   Assessment & Plan    # Chest pain:  Symptoms are non-cardiac and related to his esophageal disease and dysphagia.  Cardiac enzymes are negative. EKG unremarkable.  He is at low cardiovascular risk for EGD today.  His symptoms are not exertional.  He also has chest discomfort to the touch likely 2/2 emesis and retching.  If he continues having chest pain after his procedure we can consider an ischemic evaluation.  Otherwise, defer to outpatient follow up.    # Hypertensive urgency:  BP now much better.  Suspect that some of this may be due to inability to get his  pills down.  Now much better on IV hydralazine .  Continue 10mg  IV q8h.  Resume carvedilol, amlodipine  and lisinopril once he is better able to tolerate oral meds.   # Aortic atherosclerosis: # Hyperlipidemia: Aspirin  and statin once GI symptoms resolve.       For questions or updates, please contact [facility] Please consult [email or web address] for contact info under        Signed, Maudine Sos, MD  09/24/2023, 9:52 AM

## 2023-09-24 NOTE — Transfer of Care (Signed)
 Immediate Anesthesia Transfer of Care Note  Patient: [name] Daugherty  Procedure(s) Performed: EGD (ESOPHAGOGASTRODUODENOSCOPY) Balloon dilation wire-guided BIOPSY, SKIN, SUBCUTANEOUS TISSUE, OR MUCOUS MEMBRANE  Patient Location: PACU  Anesthesia Type:MAC  Level of Consciousness: awake, alert , and oriented  Airway & Oxygen Therapy: Patient Spontanous Breathing and Patient connected to nasal cannula oxygen  Post-op Assessment: Report given to RN and Post -op Vital signs reviewed and stable  Post vital signs: Reviewed and stable  Last Vitals:  Vitals Value Taken Time  BP    Temp    Pulse 105 09/24/23 1240  Resp 16 09/24/23 1240  SpO2 96 % 09/24/23 1240  Vitals shown include unfiled device data.  Last Pain:  Vitals:   09/24/23 1051  TempSrc: Temporal  PainSc: 0-No pain         Complications: No notable events documented.

## 2023-09-24 NOTE — Progress Notes (Signed)
 Patient arrived to room. BP high. Used interpreter to ask admission questions. Son arrived in room during admission questions. Call bell within reach. Food ordered.

## 2023-09-24 NOTE — Care Management Obs Status (Signed)
 MEDICARE OBSERVATION STATUS NOTIFICATION   Patient Details  Name: [name] Daugherty MRN: [id number] Date of Birth: Dec 29, 1955   Medicare Observation Status Notification Given:  Yes  Moon/Obs letter done copy given   Wynonia Hedges 09/24/2023, 3:47 PM

## 2023-09-24 NOTE — Plan of Care (Signed)

## 2023-09-24 NOTE — Op Note (Signed)
 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 Estimated blood loss:                            Minimal. Estimated Blood Loss:     Estimated blood loss was minimal. Procedure:                Pre-Anesthesia Assessment:                           - Prior to the procedure, a History and Physical                            was performed, and patient medications and                            allergies were reviewed. The patient's tolerance of                            previous anesthesia was also reviewed. The risks                            and benefits of the procedure and the sedation                            options and risks were discussed with the patient.                            All questions were answered, and informed consent                            was obtained. Prior Anticoagulants: The patient has  taken no anticoagulant or antiplatelet agents. ASA                            Grade Assessment: II - A patient with mild systemic                            disease. After reviewing the risks and benefits,                            the patient was deemed in satisfactory condition to                            undergo the procedure.                           After obtaining informed  consent, the endoscope was                            passed under direct vision. Throughout the                            procedure, the patient's blood pressure, pulse, and                            oxygen saturations were monitored continuously. The                            [id number]-[id number] ([id number] Olympus endoscope was introduced                            through the mouth, and advanced to the second part                            of duodenum. The upper GI endoscopy was                            accomplished without difficulty. The patient                            tolerated the procedure well. Scope In: Scope Out: Findings:      The upper third of the esophagus and middle third of the esophagus were       normal.      The mucosa of the distal esophagus appeared congested but without       erosive changes      The lumen of the distal esophagus in the region of the congested mucosa       appeared to be moderately compressed and was not very distensible. There       was some appearance of presbyesophagus. Decision was made to perform a       dilation of this region. A TTS dilator was passed through the scope.       Dilation with a 15-16.5-18 mm balloon dilator was performed to 16.5 mm.      Biopsies were obtained from the proximal and distal esophagus with cold  forceps for histology to evaluate for eosinophilic esophagitis.      The gastric body, gastric antrum, cardia (on retroflexion) and gastric       fundus (on retroflexion) were normal. Biopsies were taken with a cold       forceps for Helicobacter pylori testing.      The duodenal bulb and second portion of the duodenum were normal. Impression:               - Normal upper third of esophagus and middle third                            of esophagus.                           - Segment of congested mucosa that was not very                            distensible in the lower esophagus. This may have                             been the region of previous fishbone impaction                            perforation. Dilated to 16.5 mm. Biopsies performed                            of this region and upper esophagus.                           - Normal gastric body, antrum, cardia and gastric                            fundus. Biopsied. Evaluate for H. pylori.                           - Normal duodenal bulb and second portion of the                            duodenum. Recommendation:           - Return patient to hospital ward for ongoing care.                           - Await pathology results.                           - If biopsy results are unrevealing can consider                            outpatient esophageal manometry for further                            evaluation of dysphagia. Patient may be                            experiencing symptoms  of esophageal spasm                            contributing to chest pain and dysphagia.                           - The findings and recommendations were discussed                            with the patient's family. Procedure Code(s):        --- Professional ---                           [phone], Esophagogastroduodenoscopy, flexible,                            transoral; with transendoscopic balloon dilation of                            esophagus (less than 30 mm diameter) Diagnosis Code(s):        --- Professional ---                           K22.2, Esophageal obstruction                           R10.13, Epigastric pain                           R13.10, Dysphagia, unspecified                           R07.89, Other chest pain CPT copyright 2022 American Medical Association. All rights reserved. The codes documented in this report are preliminary and upon coder review may  be revised to meet current compliance requirements. Eugenia Hess, MD 09/24/2023 12:48:20 PM This report has been signed electronically. Number of Addenda: 0

## 2023-09-24 NOTE — Anesthesia Postprocedure Evaluation (Signed)
 Anesthesia Post Note  Patient: [name] Daugherty  Procedure(s) Performed: EGD (ESOPHAGOGASTRODUODENOSCOPY) Balloon dilation wire-guided BIOPSY, SKIN, SUBCUTANEOUS TISSUE, OR MUCOUS MEMBRANE     Patient location during evaluation: PACU Anesthesia Type: MAC Level of consciousness: awake and alert Pain management: pain level controlled Vital Signs Assessment: post-procedure vital signs reviewed and stable Respiratory status: spontaneous breathing, nonlabored ventilation and respiratory function stable Cardiovascular status: blood pressure returned to baseline and stable Postop Assessment: no apparent nausea or vomiting Anesthetic complications: no   No notable events documented.  Last Vitals:  Vitals:   09/24/23 1240 09/24/23 1250  BP: 103/60 116/68  Pulse: (!) 107 98  Resp: 17 16  Temp:    SpO2: 96% 97%    Last Pain:  Vitals:   09/24/23 1250  TempSrc:   PainSc: 0-No pain                 Jacquelyne Matte

## 2023-09-24 NOTE — Progress Notes (Signed)
 Son at bedside to assist with communicating with pt about medications administration.

## 2023-09-24 NOTE — TOC CM/SW Note (Addendum)
 Transition of Care [facility]) - Inpatient Brief Assessment   Patient Details  Name: Keyth Kupec MRN: [id number] Date of Birth: 1956-02-20  [facility] of [facility]) CM/SW Contact:    Juliane Och, LCSW Phone Number: 09/24/2023, 9:07 AM   Clinical Narrative:  9:07 AM Per chart review, patient resides at home with relatives. Patient does not have SNF history. Patient has HH and DME (IV pumping/equipment) history with [facility]. Patient has insurance but not a PCP. CSW consulted RNCM for PCP assistance. RNCM consulted CMA for PCP appointment scheduling. TOC will continue to follow and be available to assist.  Transition of Care Asessment: Insurance and Status: Insurance coverage has been reviewed Patient has primary care physician: No Home environment has been reviewed: Private Residence Prior level of function:: N/A Prior/Current Home Services: No current home services (Has HH/DME history) Social Drivers of Health Review: SDOH reviewed no interventions necessary Readmission risk has been reviewed: Yes Transition of care needs: transition of care needs identified, TOC will continue to follow

## 2023-09-24 NOTE — Anesthesia Preprocedure Evaluation (Addendum)
 Anesthesia Evaluation  Patient identified by MRN, date of birth, ID band Patient awake    Reviewed: Allergy & Precautions, H&P , NPO status , Patient's Chart, lab work & pertinent test results, reviewed documented beta blocker date and time   Airway Mallampati: III  TM Distance: >3 FB Neck ROM: Full    Dental  (+) Dental Advisory Given, Poor Dentition   Pulmonary Current Smoker   Pulmonary exam normal breath sounds clear to auscultation       Cardiovascular hypertension (148/81 preop), Pt. on medications and Pt. on home beta blockers Normal cardiovascular exam Rhythm:Regular Rate:Normal  Echo 05/25/23; SUMMARY  The left ventricular size is normal.  There is normal left ventricular wall thickness.  Left ventricular systolic function is normal.  LV ejection fraction = 55-60%.  The left ventricular wall motion is normal.  There is mild aortic regurgitation.  No pulmonary hypertension.  Mildly dilated ascending aorta.  - Ascending aorta measures 4.1cm.  - Aortic root measures 3.8 cm.  No significant change compared to the prior study.   Seen by cards, chest pain thought 2/2 esophageal issues    Neuro/Psych negative neurological ROS  negative psych ROS   GI/Hepatic Neg liver ROS,GERD  Medicated and Controlled,, history of esophageal perforation following bone ingestion many years ago, with chronic dysphagia at baseline, presenting with worsening dysphagia as well as chest and epigastric pain intolerance of p.o. intake   Endo/Other  BMI 31  Renal/GU Renal InsufficiencyRenal diseaseCr 1.25  negative genitourinary   Musculoskeletal negative musculoskeletal ROS (+)    Abdominal   Peds negative pediatric ROS (+)  Hematology  (+) Blood dyscrasia ([id number])   Anesthesia Other Findings   Reproductive/Obstetrics negative OB ROS                              Anesthesia Physical Anesthesia  Plan  ASA: 3  Anesthesia Plan: MAC   Post-op Pain Management:    Induction:   PONV Risk Score and Plan: 2 and Propofol  infusion and TIVA  Airway Management Planned: Natural Airway and Simple Face Mask  Additional Equipment: None  Intra-op Plan:   Post-operative Plan:   Informed Consent: I have reviewed the patients History and Physical, chart, labs and discussed the procedure including the risks, benefits and alternatives for the proposed anesthesia with the patient or authorized representative who has indicated his/her understanding and acceptance.       Plan Discussed with: CRNA  Anesthesia Plan Comments:         Anesthesia Quick Evaluation

## 2023-09-24 NOTE — Progress Notes (Addendum)
 PROGRESS NOTE  [name] Daugherty ZOX:[id number] DOB: Sep 22, 1955   PCP: Patient, No Pcp Per  Patient is from: Home.  Lives with family.  DOA: 09/23/2023 LOS: 0  Chief complaints Chief Complaint  Patient presents with   Chest Pain     Brief Narrative / Interim history: 68 year old Nepali speaking male with PMH of esophageal perforation, esophageal dysphagia, HTN, tobacco use disorder and ascending aortic aneurysm presenting with chest pain and dysphagia, both solid and liquid, and admitted for evaluation.  In ED, hypertensive to 232/95.  Other vital stable. Cr 1.28.  BUN 11.  Total bili 1.3.  Serial troponin negative.  CBC without significant finding.  EKG sinus rhythm without acute ischemic finding.  RUQ US  with fatty infiltration.  CT angio chest showed top-normal ascending aorta measures up to 4 cm in diameter and simple distal gastritis.  Cardiology and GI consulted.  Addendum - EGD with segment of congested mucosa that was not very distended in the lower esophagus.  Dilated to 16.5 mm and biopsied.  Normal stomach (biopsied) and duodenum.  GI recommended considering outpatient esophageal manometry for further evaluation if biopsies are unrevealing   Subjective: Seen and examined earlier this morning with the help of patient's son as an interpreter (by choice).  Reports chest pain but points at epigastric area.  Denies cough, shortness of breath, nausea or vomiting.  Denies UTI symptoms.  Objective: Vitals:   09/23/23 2100 09/23/23 2355 09/24/23 0520 09/24/23 0728  BP: (!) 163/113 (!) 143/84 (!) 146/89 (!) 148/81  Pulse:  86 85 91  Resp:  16 17 15   Temp: 97.8 F (36.6 C) 98.3 F (36.8 C) 98.8 F (37.1 C) 98.9 F (37.2 C)  TempSrc: Oral Oral Oral Oral  SpO2:  94% 95% 95%  Weight:      Height:        Examination:  GENERAL: No apparent distress.  Nontoxic. HEENT: MMM.  Vision and hearing grossly intact.  NECK: Supple.  No apparent JVD.  RESP:  No IWOB.  Fair aeration  bilaterally. CVS:  RRR. Heart sounds normal.  ABD/GI/GU: BS+. Abd soft.  Epigastric tenderness. MSK/EXT:  Moves extremities. No apparent deformity. No edema.  SKIN: no apparent skin lesion or wound NEURO: Awake, alert and oriented appropriately.  No apparent focal neuro deficit. PSYCH: Calm. Normal affect.   Consultants:  Cardiology Gastroenterology  Procedures: None  Microbiology summarized: MRSA PCR screen nonreactive  Assessment and plan: Epigastric abdominal pain: Patient reports chest pain but points at epigastric area.  He is tender to palpation over epigastric region.  EKG and serial troponin negative.  CT angio without acute finding but possible distal gastritis.  Patient with known history of esophageal perforation and esophageal dysphagia.  Reports solid and liquid dysphagia.  Denies NSAID use. -GI on board-plan for EGD today -Continue IV Protonix  and Carafate   Hypertensive urgency: Initial BP 232/95.  BP improved after initiation of home antihypertensive meds.  Suspect noncompliance. -Appreciate input by cardiology-continue home amlodipine , Coreg, lisinopril -IV hydralazine  as needed -Will discontinue IV fluid after EGD  Hyperbilirubinemia: Mild with indirect predominance.  Gilbert's syndrome? - Follow morning labs  Ascending aortic dilattation: 4 cm on CT angio. -Annual CT recommended  Tobacco use disorder: Reports using chewable tobacco. -Encourage cessation -Nicotine patch   AKI ruled out. -Follow morning labs -Discontinue IV fluid   Class I obesity Body mass index is 30.74 kg/m.           DVT prophylaxis:  heparin  injection 5,000 Units Start: 09/23/23  1530  Code Status: Full code Family Communication: Updated patient's son at bedside Level of care: Telemetry Cardiac Status is: Observation The patient will require care spanning > 2 midnights and should be moved to inpatient because: Epigastric pain, esophageal dysphagia and uncontrolled  hypertension   Final disposition: Likely home   55 minutes with more than 50% spent in reviewing records, counseling patient/family and coordinating care.   Sch Meds:  Scheduled Meds:  [MAR Hold] heparin   5,000 Units Subcutaneous Q8H   [MAR Hold] hydrALAZINE   10 mg Intravenous Q8H   [MAR Hold] mupirocin ointment  1 Application Nasal BID   [MAR Hold] pantoprazole  (PROTONIX ) IV  40 mg Intravenous Q12H   [MAR Hold] sucralfate   1 g Oral TID WC & HS   Continuous Infusions:  lactated ringers  75 mL/hr at 09/24/23 0800   PRN Meds:.[MAR Hold] acetaminophen , [MAR Hold] hydrALAZINE , [MAR Hold] nitroGLYCERIN , [MAR Hold] ondansetron  (ZOFRAN ) IV  Antimicrobials: Anti-infectives (From admission, onward)    None        I have personally reviewed the following labs and images: CBC: Recent Labs  Lab 09/23/23 1111 09/23/23 1521  WBC 8.7 8.7  NEUTROABS 4.5  --   HGB 16.3 16.0  HCT 46.9 46.2  MCV 88.3 88.8  PLT 133* 130*   BMP &GFR Recent Labs  Lab 09/23/23 1111 09/23/23 1429 09/23/23 1521  NA 139  --   --   K 4.1  --   --   CL 106  --   --   CO2 24  --   --   GLUCOSE 92  --   --   BUN 11  --   --   CREATININE 1.28*  --  1.25*  CALCIUM 9.1  --   --   MG  --  1.4*  --    Estimated Creatinine Clearance: 48.5 mL/min (A) (by C-G formula based on SCr of 1.25 mg/dL (H)). Liver & Pancreas: Recent Labs  Lab 09/23/23 1111 09/23/23 1521  AST 30  --   ALT 22  --   ALKPHOS 38  --   BILITOT 1.3* 1.8*  PROT 7.9  --   ALBUMIN 3.4*  --    Recent Labs  Lab 09/23/23 1111  LIPASE 29   No results for input(s): "AMMONIA" in the last 168 hours. Diabetic: Recent Labs    09/23/23 1521  HGBA1C 4.9   No results for input(s): "GLUCAP" in the last 168 hours. Cardiac Enzymes: No results for input(s): "CKTOTAL", "CKMB", "CKMBINDEX", "TROPONINI" in the last 168 hours. No results for input(s): "PROBNP" in the last 8760 hours. Coagulation Profile: No results for input(s): "INR",  "PROTIME" in the last 168 hours. Thyroid Function Tests: Recent Labs    09/23/23 1521  TSH 1.275  FREET4 0.75   Lipid Profile: No results for input(s): "CHOL", "HDL", "LDLCALC", "TRIG", "CHOLHDL", "LDLDIRECT" in the last 72 hours. Anemia Panel: No results for input(s): "VITAMINB12", "FOLATE", "FERRITIN", "TIBC", "IRON", "RETICCTPCT" in the last 72 hours. Urine analysis:    Component Value Date/Time   COLORURINE YELLOW 09/24/2023 0000   APPEARANCEUR CLEAR 09/24/2023 0000   LABSPEC 1.017 09/24/2023 0000   PHURINE 6.0 09/24/2023 0000   GLUCOSEU NEGATIVE 09/24/2023 0000   HGBUR NEGATIVE 09/24/2023 0000   BILIRUBINUR NEGATIVE 09/24/2023 0000   KETONESUR NEGATIVE 09/24/2023 0000   PROTEINUR NEGATIVE 09/24/2023 0000   NITRITE NEGATIVE 09/24/2023 0000   LEUKOCYTESUR NEGATIVE 09/24/2023 0000   Sepsis Labs: Invalid input(s): "PROCALCITONIN", "LACTICIDVEN"  Microbiology: Recent Results (from  the past 240 hours)  Surgical PCR screen     Status: None   Collection Time: 09/23/23 10:22 PM   Specimen: Nasal Mucosa; Nasal Swab  Result Value Ref Range Status   MRSA, PCR NEGATIVE NEGATIVE Final   Staphylococcus aureus NEGATIVE NEGATIVE Final    Comment: (NOTE) The Xpert SA Assay (FDA approved for NASAL specimens in patients 11 years of age and older), is one component of a comprehensive surveillance program. It is not intended to diagnose infection nor to guide or monitor treatment. Performed at [facility], [address], [place], [place] [address]     Radiology Studies: CT Angio Chest/Abd/Pel for Dissection W and/or W/WO Result Date: 09/23/2023 CLINICAL DATA:  Chest pain. Concern for aortic aneurysm or dissection. EXAM: CT ANGIOGRAPHY CHEST, ABDOMEN AND PELVIS TECHNIQUE: Non-contrast CT of the chest was initially obtained. Multidetector CT imaging through the chest, abdomen and pelvis was performed using the standard protocol during bolus administration of intravenous  contrast. Multiplanar reconstructed images and MIPs were obtained and reviewed to evaluate the vascular anatomy. RADIATION DOSE REDUCTION: This exam was performed according to the departmental dose-optimization program which includes automated exposure control, adjustment of the mA and/or kV according to patient size and/or use of iterative reconstruction technique. CONTRAST:  80mL OMNIPAQUE  IOHEXOL  350 MG/ML SOLN COMPARISON:  CT dated 04/28/2017. FINDINGS: CTA CHEST FINDINGS Cardiovascular: There is no cardiomegaly or pericardial effusion. Top-normal ascending aorta measures 4 cm in diameter. No aortic dissection. The origins of the great vessels of the aortic arch appear patent. No pulmonary artery embolus identified. Mediastinum/Nodes: No hilar or mediastinal adenopathy. The esophagus is grossly unremarkable. No mediastinal fluid collection. Lungs/Pleura: No focal consolidation, pleural effusion, or pneumothorax. The central airways are patent. Musculoskeletal: No acute osseous pathology. Review of the MIP images confirms the above findings. CTA ABDOMEN AND PELVIS FINDINGS VASCULAR Aorta: Mild atherosclerotic calcification. No aneurysmal dilatation or dissection. No periaortic fluid collection. Celiac: Patent without evidence of aneurysm, dissection, vasculitis or significant stenosis. SMA: Patent without evidence of aneurysm, dissection, vasculitis or significant stenosis. Renals: The left renal artery is patent. There is focal area of high-grade narrowing of the proximal right renal artery. The right renal artery remains patent. IMA: The IMA is patent. Inflow: The iliac arteries are patent. No aneurysmal dilatation or dissection. Veins: No obvious venous abnormality within the limitations of this arterial phase study. Review of the MIP images confirms the above findings. NON-VASCULAR No intra-abdominal free air or free fluid. Hepatobiliary: The liver is unremarkable. No biliary dilatation. The gallbladder is  unremarkable Pancreas: Unremarkable. No pancreatic ductal dilatation or surrounding inflammatory changes. Spleen: Normal in size without focal abnormality. Adrenals/Urinary Tract: The adrenal glands unremarkable. The kidneys, visualized ureters, and urinary bladder appear unremarkable. Stomach/Bowel: There is thickened appearance of the distal stomach involving the gastric antrum and pylorus with mild adjacent stranding which may represent gastritis. There is no bowel obstruction. The appendix is normal. Lymphatic: No adenopathy. Reproductive: The prostate and seminal vesicles are grossly unremarkable. Other: None Musculoskeletal: No acute or significant osseous findings. Review of the MIP images confirms the above findings. IMPRESSION: 1. No acute intrathoracic pathology. Top-normal ascending aorta measures up to 4 cm in diameter. Recommend annual imaging followup by CTA or MRA. This recommendation follows 2010 ACCF/AHA/AATS/ACR/ASA/SCA/SCAI/SIR/STS/SVM Guidelines for the Diagnosis and Management of Patients with Thoracic Aortic Disease. Circulation. 2010; 121: N829-F621. Aortic aneurysm NOS (ICD10-I71.9) 2. Possible distal gastritis. Clinical correlation is recommended. No bowel obstruction. Normal appendix. Electronically Signed   By:  Angus Bark M.D.   On: 09/23/2023 13:45   DG Chest Port 1 View Result Date: 09/23/2023 CLINICAL DATA:  Chest pain. EXAM: PORTABLE CHEST 1 VIEW COMPARISON:  Chest radiograph dated 11/11/2018. FINDINGS: No focal consolidation, pleural effusion, or pneumothorax. Stable cardiac silhouette. No acute osseous pathology. IMPRESSION: No active disease. Electronically Signed   By: Angus Bark M.D.   On: 09/23/2023 13:03   US  Abdomen Limited RUQ (LIVER/GB) Result Date: 09/23/2023 CLINICAL DATA:  Right upper quadrant pain EXAM: ULTRASOUND ABDOMEN LIMITED RIGHT UPPER QUADRANT COMPARISON:  CT 12/20/1916. FINDINGS: Gallbladder: No gallstones or wall thickening visualized. No  sonographic Murphy sign noted by sonographer. Common bile duct: Diameter: 4 mm Liver: Diffusely echogenic hepatic parenchyma consistent with fatty liver infiltration. With this level of echogenicity evaluation for underlying mass lesion is limited and if needed follow-up contrast CT or MRI as clinically appropriate. Portal vein is patent on color Doppler imaging with normal direction of blood flow towards the liver. Other: None. IMPRESSION: No gallstones or ductal dilatation.  Fatty liver infiltration. Electronically Signed   By: Adrianna Horde M.D.   On: 09/23/2023 11:58      Arletta Lumadue T. Declan Adamson [facility]  If 7PM-7AM, please contact night-coverage [email or web address] 09/24/2023, 10:52 AM

## 2023-09-24 NOTE — H&P (Signed)
  Gastroenterology History and Physical   Primary Care Physician:  [name] Daugherty, No Pcp Per   Reason for Procedure:  Dysphagia, chest pain, epigastric pain  Plan:    Upper endoscopy with possible dilation     HPI: [name] Daugherty is a 68 y.o. male undergoing upper endoscopy with possible dilation for evaluation of dysphagia, chest pain and epigastric pain.  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  Subsequently seen by [facility] for chronic dysphagia in 2022.  EGD with biopsies was normal.  Esophageal manometry was attempted but he was unable to tolerate the procedure.   Was admitted to the hospital with hypertension and chest pain.  Has had normal troponin and no evidence of ischemia.  EGD is being performed to evaluate for a GI etiology of dysphagia, chest pain and epigastric pain.   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   Due to retained foreign body    Hypertension     Past Surgical History:  Procedure Laterality Date   ESOPHAGOGASTRODUODENOSCOPY N/A 08/04/2014   Procedure: FOREIGN BODY REMOVAL ( ESOPHAGEAL MASS );  Surgeon: Tami Falcon, MD;  Location: [facility] OR;  Service: Endoscopy;  Laterality: N/A;   ESOPHAGOGASTRODUODENOSCOPY Left 08/03/2014   Procedure: ESOPHAGOGASTRODUODENOSCOPY (EGD);  Surgeon: Tami Falcon, MD;  Location: [facility] ENDOSCOPY;  Service: Endoscopy;  Laterality: Left;    Prior to Admission medications   Medication Sig Start Date End Date Taking? Authorizing Provider  amLODipine  (NORVASC ) 10 MG tablet Take 10 mg by mouth daily. 07/22/23  Yes [provider]  aspirin  EC 81 MG tablet Take 1 tablet by mouth daily. 07/05/23 07/04/24 Yes [provider]  carvedilol (COREG) 6.25 MG tablet Take 6.25 mg by mouth 2 (two) times daily with a meal. 07/05/23 07/04/24 Yes [provider]  pantoprazole  (PROTONIX ) 40 MG tablet Take 40 mg by mouth daily as needed (acid  reflux 20 minutes before breakfast). 05/30/15  Yes [provider]  lisinopril (ZESTRIL) 20 MG tablet Take 20 mg by mouth daily. 07/22/23   [provider]    Current Facility-Administered Medications  Medication Dose Route Frequency Provider Last Rate Last Admin   [MAR Hold] acetaminophen  (TYLENOL ) tablet 650 mg  650 mg Oral Q4H PRN Patel, Ekta V, MD   650 mg at 09/24/23 0754   [facility] Hold] heparin  injection 5,000 Units  5,000 Units Subcutaneous Q8H Lavanda Porter, MD   5,000 Units at 09/24/23 0528   [facility] Hold] hydrALAZINE  (APRESOLINE ) injection 10 mg  10 mg Intravenous Q8H Patel, Ekta V, MD   10 mg at 09/24/23 0528   [MAR Hold] hydrALAZINE  (APRESOLINE ) injection 5 mg  5 mg Intravenous Q4H PRN Patel, Ekta V, MD       [MAR Hold] mupirocin ointment (BACTROBAN) 2 % 1 Application  1 Application Nasal BID Kraig Peru, MD       [MAR Hold] nitroGLYCERIN  (NITROSTAT ) SL tablet 0.4 mg  0.4 mg Sublingual Q5 min PRN Patel, Ekta V, MD       [MAR Hold] ondansetron  (ZOFRAN ) injection 4 mg  4 mg Intravenous Q6H PRN Patel, Ekta V, MD       [MAR Hold] pantoprazole  (PROTONIX ) injection 40 mg  40 mg Intravenous Q12H Patel, Ekta V, MD   40 mg at 09/24/23 0948   [MAR Hold] sucralfate  (CARAFATE ) 1 GM/10ML suspension 1 g  1 g Oral TID WC & HS Patel, Ekta V, MD   1 g  at 09/24/23 0754    Allergies as of 09/23/2023   (No Known Allergies)    Family History  Problem Relation Age of Onset   Sudden Cardiac Death Neg Hx     Social History   Socioeconomic History   Marital status: Married    Spouse name: Not on file   Number of children: Not on file   Years of education: Not on file   Highest education level: Not on file  Occupational History   Not on file  Tobacco Use   Smoking status: Some Days   Smokeless tobacco: Current  Substance and Sexual Activity   Alcohol use: Yes    Comment: social   Drug use: No   Sexual activity: Not on file  Other Topics Concern   Not on file  Social  History Narrative   Not on file   Social Drivers of Health   Financial Resource Strain: Not on file  Food Insecurity: No Food Insecurity (09/23/2023)   Hunger Vital Sign    Worried About Running Out of Food in the Last Year: Never true    Ran Out of Food in the Last Year: Never true  Transportation Needs: No Transportation Needs (09/23/2023)   PRAPARE - Administrator, Civil Service (Medical): No    Lack of Transportation (Non-Medical): No  Physical Activity: Not on file  Stress: Not on file  Social Connections: Not on file  Intimate Partner Violence: Not At Risk (09/23/2023)   Humiliation, Afraid, Rape, and Kick questionnaire    Fear of Current or Ex-Partner: No    Emotionally Abused: No    Physically Abused: No    Sexually Abused: No    Review of Systems:  All other review of systems negative except as mentioned in the HPI.  Physical Exam: Vital signs BP (!) 174/76   Pulse 80   Temp (!) 97.1 F (36.2 C) (Temporal)   Resp 17   Ht 5' 0.5" (1.537 m)   Wt 72.6 kg   SpO2 96%   BMI 30.74 kg/m   General:   Alert,  Well-developed, well-nourished, pleasant and cooperative in NAD Lungs:  Clear throughout to auscultation.   Heart:  Regular rate and rhythm; no murmurs, clicks, rubs,  or gallops. Abdomen:  Soft, mild tenderness to palpation over the epigastrium, and nondistended. Normal bowel sounds.   Neuro/Psych:  Normal mood and affect. A and O x 3  Eugenia Hess, MD [facility]astroenter[facility]

## 2023-09-25 ENCOUNTER — Other Ambulatory Visit (HOSPITAL_COMMUNITY): Payer: Self-pay

## 2023-09-25 DIAGNOSIS — R0789 Other chest pain: Secondary | ICD-10-CM | POA: Diagnosis not present

## 2023-09-25 DIAGNOSIS — R1319 Other dysphagia: Secondary | ICD-10-CM | POA: Diagnosis not present

## 2023-09-25 DIAGNOSIS — I700 Atherosclerosis of aorta: Secondary | ICD-10-CM | POA: Diagnosis not present

## 2023-09-25 DIAGNOSIS — R1310 Dysphagia, unspecified: Secondary | ICD-10-CM | POA: Diagnosis not present

## 2023-09-25 LAB — COMPREHENSIVE METABOLIC PANEL WITH GFR
ALT: 20 U/L (ref 0–44)
AST: 28 U/L (ref 15–41)
Albumin: 3.1 g/dL — ABNORMAL LOW (ref 3.5–5.0)
Alkaline Phosphatase: 44 U/L (ref 38–126)
Anion gap: 9 (ref 5–15)
BUN: 17 mg/dL (ref 8–23)
CO2: 22 mmol/L (ref 22–32)
Calcium: 8.7 mg/dL — ABNORMAL LOW (ref 8.9–10.3)
Chloride: 105 mmol/L (ref 98–111)
Creatinine, Ser: 1.42 mg/dL — ABNORMAL HIGH (ref 0.61–1.24)
GFR, Estimated: 54 mL/min — ABNORMAL LOW (ref >60)
Glucose, Bld: 89 mg/dL (ref 70–99)
Potassium: 4 mmol/L (ref 3.5–5.1)
Sodium: 136 mmol/L (ref 135–145)
Total Bilirubin: 1.7 mg/dL — ABNORMAL HIGH (ref 0.0–1.2)
Total Protein: 7 g/dL (ref 6.5–8.1)

## 2023-09-25 LAB — CBC
HCT: 44 % (ref 39.0–52.0)
Hemoglobin: 14.9 g/dL (ref 13.0–17.0)
MCH: 29.7 pg (ref 26.0–34.0)
MCHC: 33.9 g/dL (ref 30.0–36.0)
MCV: 87.6 fL (ref 80.0–100.0)
Platelets: 141 10*3/uL — ABNORMAL LOW (ref 150–400)
RBC: 5.02 MIL/uL (ref 4.22–5.81)
RDW: 14.3 % (ref 11.5–15.5)
WBC: 7.9 10*3/uL (ref 4.0–10.5)
nRBC: 0 % (ref 0.0–0.2)

## 2023-09-25 LAB — MAGNESIUM: Magnesium: 2 mg/dL (ref 1.7–2.4)

## 2023-09-25 MED ORDER — PANTOPRAZOLE SODIUM 40 MG PO TBEC
40.0000 mg | DELAYED_RELEASE_TABLET | Freq: Every day | ORAL | 2 refills | Status: DC
Start: 2023-09-25 — End: 2023-09-25
  Filled 2023-09-25: qty 30, 30d supply, fill #0

## 2023-09-25 MED ORDER — AMLODIPINE BESYLATE 10 MG PO TABS
10.0000 mg | ORAL_TABLET | Freq: Every day | ORAL | Status: DC
  Administered 2023-09-25: 10 mg via ORAL
  Filled 2023-09-25: qty 1

## 2023-09-25 MED ORDER — AMLODIPINE BESYLATE 10 MG PO TABS: 10.0000 mg | ORAL_TABLET | Freq: Every day | ORAL | 0 refills | Status: AC

## 2023-09-25 MED ORDER — AMLODIPINE BESYLATE 10 MG PO TABS
10.0000 mg | ORAL_TABLET | Freq: Every day | ORAL | 0 refills | Status: DC
  Filled 2023-09-25: qty 90, 90d supply, fill #0

## 2023-09-25 MED ORDER — CARVEDILOL 6.25 MG PO TABS: 6.2500 mg | ORAL_TABLET | Freq: Two times a day (BID) | ORAL | Status: DC

## 2023-09-25 MED ORDER — PANTOPRAZOLE SODIUM 40 MG PO TBEC: 40.0000 mg | DELAYED_RELEASE_TABLET | Freq: Every day | ORAL | 2 refills | Status: AC

## 2023-09-25 NOTE — Plan of Care (Signed)
  Problem: Cardiac: Goal: Ability to achieve and maintain adequate cardiovascular perfusion will improve Outcome: Progressing   Problem: Clinical Measurements: Goal: Ability to maintain clinical measurements within normal limits will improve Outcome: Progressing   Problem: Clinical Measurements: Goal: Will remain free from infection Outcome: Progressing   Problem: Clinical Measurements: Goal: Diagnostic test results will improve Outcome: Progressing   Problem: Clinical Measurements: Goal: Cardiovascular complication will be avoided Outcome: Progressing   Problem: Pain Managment: Goal: General experience of comfort will improve and/or be controlled Outcome: Progressing   Problem: Safety: Goal: Ability to remain free from injury will improve Outcome: Progressing

## 2023-09-25 NOTE — Discharge Summary (Signed)
 Physician Discharge Summary  Trebor Galdamez MVH:[id number] DOB: 12-Jan-1956 DOA: 09/23/2023  PCP: Patient, No Pcp Per  Admit date: 09/23/2023 Discharge date: 09/25/23  Admitted From: Home Disposition: Home Recommendations for Outpatient Follow-up:  Follow up with PCP in 1 week Assess blood pressure, CMP and CBC in 1 week Adjust antihypertensive meds as appropriate Please follow up on the following pending results: Biopsy from EGD  Home Health: No need identified Equipment/Devices: No need identified  Discharge Condition: Stable CODE STATUS: Full code  Follow-up Information     Jerrlyn Morel, NP Follow up.   Specialties: Pulmonary Disease, Endocrinology Why: TIME : 3: 40 PM   PLEASE ARRIVE AT 3:15 PM DATE : AUG  06 ,2025  PLEASE BRING ALL MEDICATION, ID and INS CARD Contact information: [address] [place] [address] [phone]                 Hospital course 68 year old Nepali speaking male with PMH of esophageal perforation, esophageal dysphagia, HTN, tobacco use disorder and ascending aortic aneurysm presenting with chest pain and dysphagia, both solid and liquid, and admitted for evaluation.  In ED, hypertensive to 232/95.  Other vital stable. Cr 1.28.  BUN 11.  Total bili 1.3.  Serial troponin negative.  CBC without significant finding.  EKG sinus rhythm without acute ischemic finding.  RUQ US  with fatty infiltration.  CT angio chest showed top-normal ascending aorta measures up to 4 cm in diameter and simple distal gastritis.  Cardiology and GI consulted.   EGD with segment of congested mucosa that was not very distended in the lower esophagus.  Dilated to 16.5 mm and biopsied.  Normal stomach (biopsied) and duodenum.  GI recommended considering outpatient esophageal manometry for further evaluation if biopsies are unrevealing.  Patient tolerated soft diet after EGD.  He is discharged on p.o. Protonix .  Biopsy pending at time of  discharge.  Cardiology evaluated patient and recommended resuming home antihypertensive meds.  However, patient blood pressure improved even before restarting home medications.  He is discharged on home amlodipine .  Carvedilol discontinued.  Lisinopril discontinued to allow renal recovery.  Reassess blood pressure and adjust meds as appropriate at follow-up.   See individual problem list below for more.   Problems addressed during this hospitalization Epigastric abdominal pain: Patient reports chest pain but points at epigastric area.  He is tender to palpation over epigastric region.  EKG and serial troponin negative.  CT angio without acute finding but possible distal gastritis.  Patient with known history of esophageal perforation and esophageal dysphagia.  Reports solid and liquid dysphagia.  Denies NSAID use.  EGD as above.  Tolerated soft diet after EGD and esophageal dilation. -Continue p.o. Protonix  40 mg daily -Follow biopsy from EGD -GI recommended outpatient esophageal manometry if biopsies unrevealing   Hypertensive urgency: Initial BP 232/95 and improved to 133/73 before restarting home meds. -Continue home amlodipine  -Discontinued Coreg and lisinopril -Reassess BP and renal function and adjust meds as appropriate at follow-up -Patient has blood pressure monitor at home.  Advised to keep blood pressure log   Hyperbilirubinemia: Mild with indirect predominance.  Gilbert's syndrome? - Recheck at follow-up   Ascending aortic dilattation: 4 cm on CT angio. -Annual CT recommended   Tobacco use disorder: Reports using chewable tobacco. -Encourage cessation   CKD-2: Creatinine slightly up. Recent Labs    09/23/23 1111 09/23/23 1521 09/24/23 1033 09/25/23 0401  BUN 11  --  15 17  CREATININE 1.28* 1.25* 1.35*  1.42*  - Discontinue home lisinopril - Reassess renal function at follow-up  Hypomagnesemia: Resolved  Language barrier: Patient's son assisted with interpretation  per patient's preference   Class I obesity Body mass index is 30.74 kg/m.             Time spent 35 minutes  Vital signs Vitals:   09/24/23 2032 09/25/23 0003 09/25/23 0442 09/25/23 0808  BP: (!) 155/79 (!) 157/76 (!) 153/69 133/73  Pulse: 82 84 85 76  Temp: 98.8 F (37.1 C) 99.1 F (37.3 C) 99 F (37.2 C) 98.4 F (36.9 C)  Resp: 16 18 18 17   Height:      Weight:      SpO2: 98% 97% 98% 100%  TempSrc: Oral Oral Oral Oral  BMI (Calculated):         Discharge exam  GENERAL: No apparent distress.  Nontoxic. HEENT: MMM.  Vision and hearing grossly intact.  NECK: Supple.  No apparent JVD.  RESP:  No IWOB.  Fair aeration bilaterally. CVS:  RRR. Heart sounds normal.  ABD/GI/GU: BS+. Abd soft.  Mild epigastric tenderness. MSK/EXT:  Moves extremities. No apparent deformity. No edema.  SKIN: no apparent skin lesion or wound NEURO: Awake and alert. Oriented appropriately.  No apparent focal neuro deficit. PSYCH: Calm. Normal affect.   Discharge Instructions Discharge Instructions     Diet - low sodium heart healthy   Complete by: As directed    Discharge instructions   Complete by: As directed    It has been a pleasure taking care of you!  You were hospitalized due to epigastric pain and high blood pressure.  You had endoscopy and esophageal dilation.  This very important that you take your medications as prescribed.  Please review your new medication list and the directions on your medications before you take them.  Follow-up with your primary care doctor in 1 to 2 weeks or sooner if needed.  Follow-up with gastroenterology per their recommendation.   Take care,   Increase activity slowly   Complete by: As directed       Allergies as of 09/25/2023   No Known Allergies      Medication List     STOP taking these medications    aspirin  EC 81 MG tablet   carvedilol 6.25 MG tablet Commonly known as: COREG   lisinopril 20 MG tablet Commonly known as:  ZESTRIL       TAKE these medications    amLODipine  10 MG tablet Commonly known as: NORVASC  Take 1 tablet (10 mg total) by mouth daily.   pantoprazole  40 MG tablet Commonly known as: PROTONIX  Take 1 tablet (40 mg total) by mouth daily. What changed:  when to take this reasons to take this        Consultations: Gastroenterology  Procedures/Studies: EGD with segment of congested mucosa that was not very distended in the lower esophagus.  Dilated to 16.5 mm and biopsied.  Normal stomach (biopsied) and duodenum.    CT Angio Chest/Abd/Pel for Dissection W and/or W/WO Result Date: 09/23/2023 CLINICAL DATA:  Chest pain. Concern for aortic aneurysm or dissection. EXAM: CT ANGIOGRAPHY CHEST, ABDOMEN AND PELVIS TECHNIQUE: Non-contrast CT of the chest was initially obtained. Multidetector CT imaging through the chest, abdomen and pelvis was performed using the standard protocol during bolus administration of intravenous contrast. Multiplanar reconstructed images and MIPs were obtained and reviewed to evaluate the vascular anatomy. RADIATION DOSE REDUCTION: This exam was performed according to the departmental dose-optimization program which  includes automated exposure control, adjustment of the mA and/or kV according to patient size and/or use of iterative reconstruction technique. CONTRAST:  80mL OMNIPAQUE  IOHEXOL  350 MG/ML SOLN COMPARISON:  CT dated 04/28/2017. FINDINGS: CTA CHEST FINDINGS Cardiovascular: There is no cardiomegaly or pericardial effusion. Top-normal ascending aorta measures 4 cm in diameter. No aortic dissection. The origins of the great vessels of the aortic arch appear patent. No pulmonary artery embolus identified. Mediastinum/Nodes: No hilar or mediastinal adenopathy. The esophagus is grossly unremarkable. No mediastinal fluid collection. Lungs/Pleura: No focal consolidation, pleural effusion, or pneumothorax. The central airways are patent. Musculoskeletal: No acute osseous  pathology. Review of the MIP images confirms the above findings. CTA ABDOMEN AND PELVIS FINDINGS VASCULAR Aorta: Mild atherosclerotic calcification. No aneurysmal dilatation or dissection. No periaortic fluid collection. Celiac: Patent without evidence of aneurysm, dissection, vasculitis or significant stenosis. SMA: Patent without evidence of aneurysm, dissection, vasculitis or significant stenosis. Renals: The left renal artery is patent. There is focal area of high-grade narrowing of the proximal right renal artery. The right renal artery remains patent. IMA: The IMA is patent. Inflow: The iliac arteries are patent. No aneurysmal dilatation or dissection. Veins: No obvious venous abnormality within the limitations of this arterial phase study. Review of the MIP images confirms the above findings. NON-VASCULAR No intra-abdominal free air or free fluid. Hepatobiliary: The liver is unremarkable. No biliary dilatation. The gallbladder is unremarkable Pancreas: Unremarkable. No pancreatic ductal dilatation or surrounding inflammatory changes. Spleen: Normal in size without focal abnormality. Adrenals/Urinary Tract: The adrenal glands unremarkable. The kidneys, visualized ureters, and urinary bladder appear unremarkable. Stomach/Bowel: There is thickened appearance of the distal stomach involving the gastric antrum and pylorus with mild adjacent stranding which may represent gastritis. There is no bowel obstruction. The appendix is normal. Lymphatic: No adenopathy. Reproductive: The prostate and seminal vesicles are grossly unremarkable. Other: None Musculoskeletal: No acute or significant osseous findings. Review of the MIP images confirms the above findings. IMPRESSION: 1. No acute intrathoracic pathology. Top-normal ascending aorta measures up to 4 cm in diameter. Recommend annual imaging followup by CTA or MRA. This recommendation follows 2010 ACCF/AHA/AATS/ACR/ASA/SCA/SCAI/SIR/STS/SVM Guidelines for the Diagnosis  and Management of Patients with Thoracic Aortic Disease. Circulation. 2010; 121: W098-J191. Aortic aneurysm NOS (ICD10-I71.9) 2. Possible distal gastritis. Clinical correlation is recommended. No bowel obstruction. Normal appendix. Electronically Signed   By: Angus Bark M.D.   On: 09/23/2023 13:45   DG Chest Port 1 View Result Date: 09/23/2023 CLINICAL DATA:  Chest pain. EXAM: PORTABLE CHEST 1 VIEW COMPARISON:  Chest radiograph dated 11/11/2018. FINDINGS: No focal consolidation, pleural effusion, or pneumothorax. Stable cardiac silhouette. No acute osseous pathology. IMPRESSION: No active disease. Electronically Signed   By: Angus Bark M.D.   On: 09/23/2023 13:03   US  Abdomen Limited RUQ (LIVER/GB) Result Date: 09/23/2023 CLINICAL DATA:  Right upper quadrant pain EXAM: ULTRASOUND ABDOMEN LIMITED RIGHT UPPER QUADRANT COMPARISON:  CT 12/20/1916. FINDINGS: Gallbladder: No gallstones or wall thickening visualized. No sonographic Murphy sign noted by sonographer. Common bile duct: Diameter: 4 mm Liver: Diffusely echogenic hepatic parenchyma consistent with fatty liver infiltration. With this level of echogenicity evaluation for underlying mass lesion is limited and if needed follow-up contrast CT or MRI as clinically appropriate. Portal vein is patent on color Doppler imaging with normal direction of blood flow towards the liver. Other: None. IMPRESSION: No gallstones or ductal dilatation.  Fatty liver infiltration. Electronically Signed   By: Adrianna Horde M.D.   On: 09/23/2023 11:58  The results of significant diagnostics from this hospitalization (including imaging, microbiology, ancillary and laboratory) are listed below for reference.     Microbiology: Recent Results (from the past 240 hours)  Surgical PCR screen     Status: None   Collection Time: 09/23/23 10:22 PM   Specimen: Nasal Mucosa; Nasal Swab  Result Value Ref Range Status   MRSA, PCR NEGATIVE NEGATIVE Final    Staphylococcus aureus NEGATIVE NEGATIVE Final    Comment: (NOTE) The Xpert SA Assay (FDA approved for NASAL specimens in patients 65 years of age and older), is one component of a comprehensive surveillance program. It is not intended to diagnose infection nor to guide or monitor treatment. Performed at [facility], [address], [place], [place] [address]      Labs:  CBC: Recent Labs  Lab 09/23/23 1111 09/23/23 1521 09/24/23 1033 09/25/23 0401  WBC 8.7 8.7 8.6 7.9  NEUTROABS 4.5  --   --   --   HGB 16.3 16.0 15.1 14.9  HCT 46.9 46.2 43.8 44.0  MCV 88.3 88.8 87.4 87.6  PLT 133* 130* 103* 141*   BMP &GFR Recent Labs  Lab 09/23/23 1111 09/23/23 1429 09/23/23 1521 09/24/23 1033 09/25/23 0401  NA 139  --   --  137 136  K 4.1  --   --  4.2 4.0  CL 106  --   --  105 105  CO2 24  --   --  20* 22  GLUCOSE 92  --   --  79 89  BUN 11  --   --  15 17  CREATININE 1.28*  --  1.25* 1.35* 1.42*  CALCIUM 9.1  --   --  8.8* 8.7*  MG  --  1.4*  --  2.0 2.0  PHOS  --   --   --  3.3  --    Estimated Creatinine Clearance: 42.7 mL/min (A) (by C-G formula based on SCr of 1.42 mg/dL (H)). Liver & Pancreas: Recent Labs  Lab 09/23/23 1111 09/23/23 1521 09/24/23 1033 09/25/23 0401  AST 30  --  26 28  ALT 22  --  20 20  ALKPHOS 38  --  46 44  BILITOT 1.3* 1.8* 1.8* 1.7*  PROT 7.9  --  7.1 7.0  ALBUMIN 3.4*  --  3.0*  3.0* 3.1*   Recent Labs  Lab 09/23/23 1111  LIPASE 29   No results for input(s): "AMMONIA" in the last 168 hours. Diabetic: Recent Labs    09/23/23 1521  HGBA1C 4.9   No results for input(s): "GLUCAP" in the last 168 hours. Cardiac Enzymes: No results for input(s): "CKTOTAL", "CKMB", "CKMBINDEX", "TROPONINI" in the last 168 hours. No results for input(s): "PROBNP" in the last 8760 hours. Coagulation Profile: No results for input(s): "INR", "PROTIME" in the last 168 hours. Thyroid Function Tests: Recent Labs    09/23/23 1521  TSH 1.275   FREET4 0.75   Lipid Profile: No results for input(s): "CHOL", "HDL", "LDLCALC", "TRIG", "CHOLHDL", "LDLDIRECT" in the last 72 hours. Anemia Panel: No results for input(s): "VITAMINB12", "FOLATE", "FERRITIN", "TIBC", "IRON", "RETICCTPCT" in the last 72 hours. Urine analysis:    Component Value Date/Time   COLORURINE YELLOW 09/24/2023 0000   APPEARANCEUR CLEAR 09/24/2023 0000   LABSPEC 1.017 09/24/2023 0000   PHURINE 6.0 09/24/2023 0000   GLUCOSEU NEGATIVE 09/24/2023 0000   HGBUR NEGATIVE 09/24/2023 0000   BILIRUBINUR NEGATIVE 09/24/2023 0000   KETONESUR NEGATIVE 09/24/2023 0000  PROTEINUR NEGATIVE 09/24/2023 0000   NITRITE NEGATIVE 09/24/2023 0000   LEUKOCYTESUR NEGATIVE 09/24/2023 0000   Sepsis Labs: Invalid input(s): "PROCALCITONIN", "LACTICIDVEN"   SIGNED:  Theadore Finger, MD  [facility] 09/25/2023, 2:49 PM

## 2023-09-27 ENCOUNTER — Encounter (HOSPITAL_COMMUNITY): Payer: Self-pay | Admitting: Pediatrics

## 2023-09-27 LAB — SURGICAL PATHOLOGY

## 2023-09-27 NOTE — Addendum Note (Signed)
 Addendum  created 09/27/23 1803 by Jacquelyne Matte, DO   Attestation recorded in [place], Intraprocedure Attestations filed

## 2023-09-28 ENCOUNTER — Ambulatory Visit: Payer: Self-pay | Admitting: Pediatrics

## 2023-10-06 ENCOUNTER — Ambulatory Visit: Payer: Self-pay | Admitting: Nurse Practitioner

## 2023-10-06 DIAGNOSIS — I10 Essential (primary) hypertension: Secondary | ICD-10-CM

## 2023-10-08 NOTE — Progress Notes (Signed)
Has a PCP

## 2023-12-15 ENCOUNTER — Ambulatory Visit: Payer: Self-pay | Admitting: Nurse Practitioner

## 2024-01-06 ENCOUNTER — Ambulatory Visit: Payer: Self-pay | Admitting: Nurse Practitioner

## 2024-04-25 ENCOUNTER — Encounter (HOSPITAL_COMMUNITY): Payer: Self-pay

## 2024-04-25 ENCOUNTER — Ambulatory Visit (HOSPITAL_COMMUNITY)
Admission: EM | Admit: 2024-04-25 | Discharge: 2024-04-25 | Disposition: A | Discharge status: Home / Self Care | Attending: Family Medicine | Admitting: Family Medicine

## 2024-04-25 DIAGNOSIS — R1010 Upper abdominal pain, unspecified: Secondary | ICD-10-CM

## 2024-04-25 LAB — POCT URINE DIPSTICK
Bilirubin, UA: NEGATIVE
Glucose, UA: NEGATIVE mg/dL
Ketones, POC UA: NEGATIVE mg/dL
Leukocytes, UA: NEGATIVE
Nitrite, UA: NEGATIVE
POC PROTEIN,UA: 100 — AB
Spec Grav, UA: 1.02 (ref 1.010–1.025)
Urobilinogen, UA: 0.2 U/dL
pH, UA: 7 (ref 5.0–8.0)

## 2024-04-25 MED ORDER — FAMOTIDINE 40 MG PO TABS: 40.0000 mg | ORAL_TABLET | Freq: Every day | ORAL | 0 refills | Status: AC

## 2024-04-25 MED ORDER — LIDOCAINE VISCOUS HCL 2 % MT SOLN
15.0000 mL | Freq: Once | OROMUCOSAL | Status: AC
  Administered 2024-04-25: 11:00:00 15 mL via OROMUCOSAL

## 2024-04-25 MED ORDER — ALUM & MAG HYDROXIDE-SIMETH 200-200-20 MG/5ML PO SUSP
ORAL | Status: AC
  Filled 2024-04-25: qty 30

## 2024-04-25 MED ORDER — ALUM & MAG HYDROXIDE-SIMETH 200-200-20 MG/5ML PO SUSP
30.0000 mL | Freq: Once | ORAL | Status: AC
  Administered 2024-04-25: 11:00:00 30 mL via ORAL

## 2024-04-25 MED ORDER — LIDOCAINE VISCOUS HCL 2 % MT SOLN
OROMUCOSAL | Status: AC
  Filled 2024-04-25: qty 15

## 2024-04-25 NOTE — Discharge Instructions (Addendum)
?? ??????? ??? ?????? ???? ???????? ????? ?????? ??????? ??????? ????? ??????, ??????? ???????? ??????? ?????????? ????????? ???? ???? ??????? ???? ??????? ?????????? ??????? ???? ????? ???? ?????? ??? °??? ??????? ????? ???? ??????? ?? ???????? ???, ?? ???????????? ???? ???????? ?????? ????????? °?  ja uh?m?l?'? p??a dukh?k? k?ra?a d?kh?'i'?k? thiy?. Di'i'?k? au?adhib??a tap?'??k? sudh?ra bha'?k?l?, tap?'??k? lak?a?ahar? sambhavata? j?'?'?ra??sam?ga sambandhita chan. Mail? [place]? l?gi tap?'??k? ph?rm?s?m? laij?nak? l?gi ark? au?adhi pa?h?'?k? raphael Lister tap?'??k? dukh?'i ph?ri pharkiy? v? bigriy? bhan?, thapa m?ly??kanak? l?gi ?patak?l?na kak?am? j?nuh?s.  He was seen today for abdominal pain.  Given your improvement with the medication given, you symptoms are likely gerd related.  I have sent another medication to take to your pharmacy to help.  If your pain returns or worsens, then go to the ER for further evaluation.

## 2024-04-25 NOTE — ED Provider Notes (Signed)
 [facility]    CSN: [id number] Arrival date & time: 04/25/24  1012      History   Chief Complaint Chief Complaint  Patient presents with   Abdominal Pain    HPI [name] Daugherty is a 68 y.o. male.    Abdominal Pain Associated symptoms: nausea   Associated symptoms: no vomiting    Interpreter used today.  Patient is with his son today.  He is here for abdominal pain today.  Pain started last evening, about MN.  It woke him from sleep.  Pain is all over the abdomen, but originates in the upper abdomen.  Right now the pain is about 7-8/10.  No vomiting.  No diarrhea or constipation.  No fevers/chills.  No urinary symptoms noted.  Last night for dinner he had rice, lentils and veggies.         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   Due to retained foreign body    Hypertension     Patient Active Problem List   Diagnosis Date Noted   Aortic atherosclerosis 09/24/2023   Esophageal dysphagia 09/24/2023   Abdominal pain, epigastric 09/23/2023   Nausea and vomiting 09/23/2023   Perforation due to foreign body accidentally left in body following endoscopic examination 07/25/2021   Other chest pain 04/28/2017   Hypertension 04/28/2017   Hypertensive urgency 04/28/2017   Esophageal thickening 04/28/2017   Ascending aortic aneurysm 04/28/2017   Esophageal perforation 08/21/2014   Dysphagia 08/03/2014   Food impaction of esophagus     Past Surgical History:  Procedure Laterality Date   BIOPSY OF SKIN SUBCUTANEOUS TISSUE AND/OR MUCOUS MEMBRANE  09/24/2023   Procedure: BIOPSY, SKIN, SUBCUTANEOUS TISSUE, OR MUCOUS MEMBRANE;  Surgeon: Suzann Inocente HERO, MD;  Location: [facility];  Service: Gastroenterology;;   ESOPHAGOGASTRODUODENOSCOPY N/A 08/04/2014   Procedure: FOREIGN BODY REMOVAL ( ESOPHAGEAL MASS );  Surgeon: Renaye Sous, MD;  Location: [facility];  Service: Endoscopy;  Laterality: N/A;   ESOPHAGOGASTRODUODENOSCOPY Left  08/03/2014   Procedure: ESOPHAGOGASTRODUODENOSCOPY (EGD);  Surgeon: Renaye Sous, MD;  Location: [facility] ENDOSCOPY;  Service: Endoscopy;  Laterality: Left;   ESOPHAGOGASTRODUODENOSCOPY N/A 09/24/2023   Procedure: EGD (ESOPHAGOGASTRODUODENOSCOPY);  Surgeon: Suzann Inocente HERO, MD;  Location: [facility] ENDOSCOPY;  Service: Gastroenterology;  Laterality: N/A;       Home Medications    Prior to Admission medications  Medication Sig Start Date End Date Taking? Authorizing Provider  amLODipine  (NORVASC ) 10 MG tablet Take 1 tablet (10 mg total) by mouth daily. 09/25/23  Yes Gonfa, Taye T, MD  pantoprazole  (PROTONIX ) 40 MG tablet Take 1 tablet (40 mg total) by mouth daily. 09/25/23  Yes Kathrin Mignon DASEN, MD    Family History Family History  Problem Relation Age of Onset   Sudden Cardiac Death Neg Hx     Social History Social History[1]   Allergies   Patient has no known allergies.   Review of Systems Review of Systems  Constitutional: Negative.   HENT: Negative.    Respiratory: Negative.    Gastrointestinal:  Positive for abdominal pain and nausea. Negative for vomiting.  Musculoskeletal: Negative.   Skin: Negative.      Physical Exam Triage Vital Signs ED Triage Vitals  Encounter Vitals Group     BP 04/25/24 1048 (!) 161/94     Girls Systolic BP Percentile --      Girls Diastolic BP Percentile --      Boys Systolic BP Percentile --      Boys  Diastolic BP Percentile --      Pulse Rate 04/25/24 1048 86     Resp 04/25/24 1048 18     Temp 04/25/24 1048 98.5 F (36.9 C)     Temp Source 04/25/24 1048 Oral     SpO2 04/25/24 1048 95 %     Weight --      Height --      Head Circumference --      Peak Flow --      Pain Score 04/25/24 1055 8     Pain Loc --      Pain Education --      Exclude from Growth Chart --    No data found.  Updated Vital Signs BP (!) 161/94 (BP Location: Left Arm)   Pulse 86   Temp 98.5 F (36.9 C) (Oral)   Resp 18   SpO2 95%   Visual Acuity Right Eye  Distance:   Left Eye Distance:   Bilateral Distance:    Right Eye Near:   Left Eye Near:    Bilateral Near:     Physical Exam Constitutional:      General: He is not in acute distress.    Appearance: He is well-developed and normal weight.     Comments: Patient appear uncomfortable, holding his stomach while sitting in the chair  Cardiovascular:     Rate and Rhythm: Normal rate and regular rhythm.  Pulmonary:     Effort: Pulmonary effort is normal.     Breath sounds: Normal breath sounds.  Abdominal:     General: Bowel sounds are increased.     Palpations: Abdomen is soft.     Tenderness: There is generalized abdominal tenderness and tenderness in the epigastric area. There is no right CVA tenderness, guarding or rebound.     Comments: Patient has generalized tenderness to the upper abdomen;  he states the most is at the epigastric area  Skin:    General: Skin is warm.  Neurological:     General: No focal deficit present.     Mental Status: He is alert.  Psychiatric:        Mood and Affect: Mood normal.      UC Treatments / Results  Labs (all labs ordered are listed, but only abnormal results are displayed) Labs Reviewed  POCT URINE DIPSTICK - Abnormal; Notable for the following components:      Result Value   Blood, UA trace-intact (*)    POC PROTEIN,UA =100 (*)    All other components within normal limits    EKG   Radiology No results found.  Procedures Procedures (including critical care time)  Medications Ordered in UC Medications  alum & mag hydroxide-simeth (MAALOX/MYLANTA) 200-200-20 MG/5ML suspension 30 mL (has no administration in time range)  lidocaine  (XYLOCAINE ) 2 % viscous mouth solution 15 mL (has no administration in time range)   After medication, patient states his pain is improved, now at about a level 2 in pain.   Initial Impression / Assessment and Plan / UC Course  I have reviewed the triage vital signs and the nursing  notes.  Pertinent labs & imaging results that were available during my care of the patient were reviewed by me and considered in my medical decision making (see chart for details).    Final Clinical Impressions(s) / UC Diagnoses   Final diagnoses:  Pain of upper abdomen     Discharge Instructions      ?? ??????? ??? ?????? ???? ???????? ????? ?????? ??????? ??????? ????? ??????, ??????? ???????? ??????? ?????????? ????????? ???? ???? ??????? ???? ??????? ?????????? ??????? ???? ????? ???? ?????? ??? ??? ??????? ????? ???? ??????? ?? ???????? ???, ?? ???????????? ???? ???????? ?????? ????????? ?  ja uh?m?l?'? p??a dukh?k? k?ra?a d?kh?'i'?k? thiy?. Di'i'?k? au?adhib??a tap?'??k? sudh?ra bha'?k?l?, tap?'??k? lak?a?ahar? sambhavata? j?'?'?ra??sam?ga sambandhita chan. Mail? [place]? l?gi tap?'??k? ph?rm?s?m? laij?nak? l?gi ark? au?adhi pa?h?'?k? raphael Lister tap?'??k? dukh?'i ph?ri pharkiy? v? bigriy? bhan?, thapa m?ly??kanak? l?gi ?patak?l?na kak?am? j?nuh?s.  He was seen today for abdominal pain.  Given your improvement with the medication given, you symptoms are likely gerd related.  I have sent another medication to take to your pharmacy to help.  If your pain returns or worsens, then go to the ER for further evaluation.     ED Prescriptions     Medication Sig Dispense Auth. Provider   famotidine  (PEPCID ) 40 MG tablet Take 1 tablet (40 mg total) by mouth daily. 14 tablet Darral Longs, MD      PDMP not reviewed this encounter.      [1]  Social History Tobacco Use   Smoking status: Some Days   Smokeless tobacco: Current  Substance Use Topics   Alcohol use: Yes    Comment: social   Drug use: No     Darral Longs, MD 04/25/24 1218

## 2024-04-25 NOTE — ED Triage Notes (Addendum)
 Patient presents with right side lower abdominal pain that started last night. Patient states he had vomiting this morning.  Pain 8/10
